# Patient Record
Sex: Female | Born: 2000 | Race: Black or African American | Hispanic: No | Marital: Married | Smoking: Never smoker
Health system: Southern US, Community
[De-identification: ages and names within clinical notes are randomized; demographics above are authoritative.]

## PROBLEM LIST (undated history)

## (undated) ENCOUNTER — Inpatient Hospital Stay (HOSPITAL_COMMUNITY): Payer: Self-pay

## (undated) DIAGNOSIS — Z789 Other specified health status: Secondary | ICD-10-CM

## (undated) HISTORY — PX: NO PAST SURGERIES: SHX2092

---
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
## 2019-06-29 ENCOUNTER — Emergency Department (HOSPITAL_COMMUNITY)
Admission: EM | Admit: 2019-06-29 | Discharge: 2019-06-29 | Discharge status: Against Medical Advice | Payer: Medicaid Other

## 2019-06-29 ENCOUNTER — Other Ambulatory Visit: Payer: Self-pay

## 2019-11-01 ENCOUNTER — Emergency Department (HOSPITAL_COMMUNITY)
Admission: EM | Admit: 2019-11-01 | Discharge: 2019-11-02 | Disposition: A | Discharge status: Home / Self Care | Payer: Medicaid Other | Attending: Emergency Medicine | Admitting: Emergency Medicine

## 2019-11-01 ENCOUNTER — Emergency Department (HOSPITAL_COMMUNITY): Payer: Medicaid Other

## 2019-11-01 ENCOUNTER — Encounter (HOSPITAL_COMMUNITY): Payer: Self-pay | Admitting: Emergency Medicine

## 2019-11-01 DIAGNOSIS — Y999 Unspecified external cause status: Secondary | ICD-10-CM | POA: Diagnosis not present

## 2019-11-01 DIAGNOSIS — R791 Abnormal coagulation profile: Secondary | ICD-10-CM | POA: Insufficient documentation

## 2019-11-01 DIAGNOSIS — S21139A Puncture wound without foreign body of unspecified front wall of thorax without penetration into thoracic cavity, initial encounter: Secondary | ICD-10-CM | POA: Insufficient documentation

## 2019-11-01 DIAGNOSIS — Y929 Unspecified place or not applicable: Secondary | ICD-10-CM | POA: Diagnosis not present

## 2019-11-01 DIAGNOSIS — R000 Tachycardia, unspecified: Secondary | ICD-10-CM | POA: Diagnosis not present

## 2019-11-01 DIAGNOSIS — S31139A Puncture wound of abdominal wall without foreign body, unspecified quadrant without penetration into peritoneal cavity, initial encounter: Secondary | ICD-10-CM | POA: Diagnosis not present

## 2019-11-01 DIAGNOSIS — T148XXA Other injury of unspecified body region, initial encounter: Secondary | ICD-10-CM

## 2019-11-01 DIAGNOSIS — S31030A Puncture wound without foreign body of lower back and pelvis without penetration into retroperitoneum, initial encounter: Secondary | ICD-10-CM | POA: Diagnosis not present

## 2019-11-01 DIAGNOSIS — T1490XA Injury, unspecified, initial encounter: Secondary | ICD-10-CM

## 2019-11-01 DIAGNOSIS — Z23 Encounter for immunization: Secondary | ICD-10-CM | POA: Diagnosis not present

## 2019-11-01 DIAGNOSIS — S21219A Laceration without foreign body of unspecified back wall of thorax without penetration into thoracic cavity, initial encounter: Secondary | ICD-10-CM | POA: Insufficient documentation

## 2019-11-01 DIAGNOSIS — Y939 Activity, unspecified: Secondary | ICD-10-CM | POA: Insufficient documentation

## 2019-11-01 DIAGNOSIS — S1193XA Puncture wound without foreign body of unspecified part of neck, initial encounter: Secondary | ICD-10-CM | POA: Insufficient documentation

## 2019-11-01 LAB — I-STAT CHEM 8, ED
BUN: 11 mg/dL (ref 6–20)
Calcium, Ion: 1.16 mmol/L (ref 1.15–1.40)
Chloride: 105 mmol/L (ref 98–111)
Creatinine, Ser: 0.7 mg/dL (ref 0.44–1.00)
Glucose, Bld: 96 mg/dL (ref 70–99)
HCT: 33 % — ABNORMAL LOW (ref 36.0–46.0)
Hemoglobin: 11.2 g/dL — ABNORMAL LOW (ref 12.0–15.0)
Potassium: 3.8 mmol/L (ref 3.5–5.1)
Sodium: 141 mmol/L (ref 135–145)
TCO2: 27 mmol/L (ref 22–32)

## 2019-11-01 LAB — COMPREHENSIVE METABOLIC PANEL
ALT: 15 U/L (ref 0–44)
AST: 16 U/L (ref 15–41)
Albumin: 3.8 g/dL (ref 3.5–5.0)
Alkaline Phosphatase: 54 U/L (ref 38–126)
Anion gap: 9 (ref 5–15)
BUN: 9 mg/dL (ref 6–20)
CO2: 26 mmol/L (ref 22–32)
Calcium: 9.3 mg/dL (ref 8.9–10.3)
Chloride: 105 mmol/L (ref 98–111)
Creatinine, Ser: 0.79 mg/dL (ref 0.44–1.00)
GFR calc Af Amer: >60 mL/min (ref >60)
GFR calc non Af Amer: >60 mL/min (ref >60)
Glucose, Bld: 98 mg/dL (ref 70–99)
Potassium: 3.5 mmol/L (ref 3.5–5.1)
Sodium: 140 mmol/L (ref 135–145)
Total Bilirubin: 0.5 mg/dL (ref 0.3–1.2)
Total Protein: 7.3 g/dL (ref 6.5–8.1)

## 2019-11-01 LAB — CBC
HCT: 36 % (ref 36.0–46.0)
Hemoglobin: 11.4 g/dL — ABNORMAL LOW (ref 12.0–15.0)
MCH: 29.6 pg (ref 26.0–34.0)
MCHC: 31.7 g/dL (ref 30.0–36.0)
MCV: 93.5 fL (ref 80.0–100.0)
Platelets: 341 10*3/uL (ref 150–400)
RBC: 3.85 MIL/uL — ABNORMAL LOW (ref 3.87–5.11)
RDW: 14.5 % (ref 11.5–15.5)
WBC: 9 10*3/uL (ref 4.0–10.5)
nRBC: 0 % (ref 0.0–0.2)

## 2019-11-01 LAB — SAMPLE TO BLOOD BANK

## 2019-11-01 LAB — ETHANOL: Alcohol, Ethyl (B): <10 mg/dL (ref <10)

## 2019-11-01 LAB — PROTIME-INR
INR: 1 (ref 0.8–1.2)
Prothrombin Time: 13.1 seconds (ref 11.4–15.2)

## 2019-11-01 MED ORDER — TETANUS-DIPHTH-ACELL PERTUSSIS 5-2.5-18.5 LF-MCG/0.5 IM SUSP
0.5000 mL | Freq: Once | INTRAMUSCULAR | Status: AC
  Administered 2019-11-01: 0.5 mL via INTRAMUSCULAR
  Filled 2019-11-01: qty 0.5

## 2019-11-01 MED ORDER — IOHEXOL 350 MG/ML SOLN
100.0000 mL | Freq: Once | INTRAVENOUS | Status: AC | PRN
  Administered 2019-11-01: 100 mL via INTRAVENOUS

## 2019-11-01 MED ORDER — FENTANYL CITRATE (PF) 100 MCG/2ML IJ SOLN
50.0000 ug | Freq: Once | INTRAMUSCULAR | Status: AC
  Administered 2019-11-01: 50 ug via INTRAVENOUS
  Filled 2019-11-01: qty 2
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
## 2019-11-01 NOTE — Progress Notes (Signed)
Chaplain engaged in initial visit with [place] and offered support.  Leah Maldonado was on the phone with her mother.  Chaplain was able to speak to Leah Maldonado's sister who is in the ED waiting area.    Chaplain will follow-up as needed.
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
## 2019-11-02 ENCOUNTER — Encounter (HOSPITAL_COMMUNITY): Payer: Self-pay

## 2019-11-02 LAB — URINALYSIS, ROUTINE W REFLEX MICROSCOPIC
Bilirubin Urine: NEGATIVE
Glucose, UA: NEGATIVE mg/dL
Hgb urine dipstick: NEGATIVE
Ketones, ur: NEGATIVE mg/dL
Leukocytes,Ua: NEGATIVE
Nitrite: NEGATIVE
Protein, ur: NEGATIVE mg/dL
Specific Gravity, Urine: 1.032 — ABNORMAL HIGH (ref 1.005–1.030)
pH: 7 (ref 5.0–8.0)

## 2019-11-02 LAB — LACTIC ACID, PLASMA: Lactic Acid, Venous: 0.8 mmol/L (ref 0.5–1.9)

## 2019-11-02 MED ORDER — OXYCODONE-ACETAMINOPHEN 5-325 MG PO TABS
1.0000 | ORAL_TABLET | Freq: Once | ORAL | Status: AC
  Administered 2019-11-02: 1 via ORAL
  Filled 2019-11-02: qty 1

## 2019-11-02 NOTE — ED Provider Notes (Signed)
They signed out to me by Dr. Rush Landmark to follow-up on trauma scans.  Scans have been reviewed and do not show any significant injury other than skin wounds.  Discussed with Dr. Doylene Canard, on-call for trauma surgery.  She feels patient can be discharged.  Discussed risks and benefits of skin closure with patient, she would prefer not to have sutures.  This will likely decrease likelihood of infection, given wound care instructions.   Gilda Crease, MD 11/02/19 Jacinta Shoe

## 2020-09-16 ENCOUNTER — Encounter (HOSPITAL_COMMUNITY)
Admission: EM | Disposition: A | Discharge status: Home / Self Care | Payer: Self-pay | Source: Home / Self Care | Attending: Emergency Medicine

## 2020-09-16 ENCOUNTER — Emergency Department (HOSPITAL_COMMUNITY): Payer: Medicaid Other | Admitting: Anesthesiology

## 2020-09-16 ENCOUNTER — Emergency Department (HOSPITAL_COMMUNITY): Payer: Medicaid Other

## 2020-09-16 ENCOUNTER — Other Ambulatory Visit: Payer: Self-pay

## 2020-09-16 ENCOUNTER — Encounter (HOSPITAL_COMMUNITY): Payer: Self-pay

## 2020-09-16 ENCOUNTER — Ambulatory Visit (HOSPITAL_COMMUNITY)
Admission: EM | Admit: 2020-09-16 | Discharge: 2020-09-16 | Disposition: A | Discharge status: Home / Self Care | Payer: Medicaid Other | Attending: Emergency Medicine | Admitting: Emergency Medicine

## 2020-09-16 DIAGNOSIS — Z20822 Contact with and (suspected) exposure to covid-19: Secondary | ICD-10-CM | POA: Diagnosis not present

## 2020-09-16 DIAGNOSIS — N939 Abnormal uterine and vaginal bleeding, unspecified: Secondary | ICD-10-CM | POA: Diagnosis present

## 2020-09-16 DIAGNOSIS — Z9889 Other specified postprocedural states: Secondary | ICD-10-CM

## 2020-09-16 DIAGNOSIS — O3680X0 Pregnancy with inconclusive fetal viability, not applicable or unspecified: Secondary | ICD-10-CM

## 2020-09-16 DIAGNOSIS — O00101 Right tubal pregnancy without intrauterine pregnancy: Secondary | ICD-10-CM

## 2020-09-16 HISTORY — PX: LAPAROSCOPIC UNILATERAL SALPINGECTOMY: SHX5934

## 2020-09-16 HISTORY — PX: DIAGNOSTIC LAPAROSCOPY WITH REMOVAL OF ECTOPIC PREGNANCY: SHX6449

## 2020-09-16 LAB — COMPREHENSIVE METABOLIC PANEL
ALT: 21 U/L (ref 0–44)
AST: 20 U/L (ref 15–41)
Albumin: 4.1 g/dL (ref 3.5–5.0)
Alkaline Phosphatase: 60 U/L (ref 38–126)
Anion gap: 12 (ref 5–15)
BUN: 9 mg/dL (ref 6–20)
CO2: 21 mmol/L — ABNORMAL LOW (ref 22–32)
Calcium: 9.5 mg/dL (ref 8.9–10.3)
Chloride: 104 mmol/L (ref 98–111)
Creatinine, Ser: 0.73 mg/dL (ref 0.44–1.00)
GFR, Estimated: >60 mL/min (ref >60)
Glucose, Bld: 150 mg/dL — ABNORMAL HIGH (ref 70–99)
Potassium: 3.9 mmol/L (ref 3.5–5.1)
Sodium: 137 mmol/L (ref 135–145)
Total Bilirubin: 0.7 mg/dL (ref 0.3–1.2)
Total Protein: 7.7 g/dL (ref 6.5–8.1)

## 2020-09-16 LAB — I-STAT BETA HCG BLOOD, ED (MC, WL, AP ONLY): I-stat hCG, quantitative: >2000 m[IU]/mL — ABNORMAL HIGH (ref <5)

## 2020-09-16 LAB — CBC WITH DIFFERENTIAL/PLATELET
Abs Immature Granulocytes: 0.05 10*3/uL (ref 0.00–0.07)
Basophils Absolute: 0 10*3/uL (ref 0.0–0.1)
Basophils Relative: 0 %
Eosinophils Absolute: 0 10*3/uL (ref 0.0–0.5)
Eosinophils Relative: 0 %
HCT: 27 % — ABNORMAL LOW (ref 36.0–46.0)
Hemoglobin: 8.6 g/dL — ABNORMAL LOW (ref 12.0–15.0)
Immature Granulocytes: 0 %
Lymphocytes Relative: 5 %
Lymphs Abs: 0.7 10*3/uL (ref 0.7–4.0)
MCH: 28 pg (ref 26.0–34.0)
MCHC: 31.9 g/dL (ref 30.0–36.0)
MCV: 87.9 fL (ref 80.0–100.0)
Monocytes Absolute: 0.4 10*3/uL (ref 0.1–1.0)
Monocytes Relative: 3 %
Neutro Abs: 12.7 10*3/uL — ABNORMAL HIGH (ref 1.7–7.7)
Neutrophils Relative %: 92 %
Platelets: 381 10*3/uL (ref 150–400)
RBC: 3.07 MIL/uL — ABNORMAL LOW (ref 3.87–5.11)
RDW: 15.6 % — ABNORMAL HIGH (ref 11.5–15.5)
WBC: 13.9 10*3/uL — ABNORMAL HIGH (ref 4.0–10.5)
nRBC: 0 % (ref 0.0–0.2)

## 2020-09-16 LAB — WET PREP, GENITAL
Clue Cells Wet Prep HPF POC: NONE SEEN
Sperm: NONE SEEN
Trich, Wet Prep: NONE SEEN
Yeast Wet Prep HPF POC: NONE SEEN

## 2020-09-16 LAB — CBC
HCT: 25.4 % — ABNORMAL LOW (ref 36.0–46.0)
Hemoglobin: 8.2 g/dL — ABNORMAL LOW (ref 12.0–15.0)
MCH: 28.6 pg (ref 26.0–34.0)
MCHC: 32.3 g/dL (ref 30.0–36.0)
MCV: 88.5 fL (ref 80.0–100.0)
Platelets: 381 10*3/uL (ref 150–400)
RBC: 2.87 MIL/uL — ABNORMAL LOW (ref 3.87–5.11)
RDW: 15.4 % (ref 11.5–15.5)
WBC: 14.3 10*3/uL — ABNORMAL HIGH (ref 4.0–10.5)
nRBC: 0 % (ref 0.0–0.2)

## 2020-09-16 LAB — RESP PANEL BY RT-PCR (FLU A&B, COVID) ARPGX2
Influenza A by PCR: NEGATIVE
Influenza B by PCR: NEGATIVE
SARS Coronavirus 2 by RT PCR: NEGATIVE

## 2020-09-16 LAB — TYPE AND SCREEN
ABO/RH(D): A POS
Antibody Screen: NEGATIVE

## 2020-09-16 LAB — HIV ANTIBODY (ROUTINE TESTING W REFLEX): HIV Screen 4th Generation wRfx: NONREACTIVE

## 2020-09-16 LAB — HCG, QUANTITATIVE, PREGNANCY: hCG, Beta Chain, Quant, S: 3414 m[IU]/mL — ABNORMAL HIGH (ref <5)

## 2020-09-16 LAB — LIPASE, BLOOD: Lipase: 30 U/L (ref 11–51)

## 2020-09-16 SURGERY — LAPAROSCOPY, WITH ECTOPIC PREGNANCY SURGICAL TREATMENT
Anesthesia: General | Laterality: Right

## 2020-09-16 MED ORDER — ONDANSETRON HCL 4 MG/2ML IJ SOLN
INTRAMUSCULAR | Status: AC
  Filled 2020-09-16: qty 2

## 2020-09-16 MED ORDER — HYDROMORPHONE HCL 1 MG/ML IJ SOLN: 0.2500 mg | INTRAMUSCULAR | Status: DC | PRN

## 2020-09-16 MED ORDER — DEXAMETHASONE SODIUM PHOSPHATE 10 MG/ML IJ SOLN
INTRAMUSCULAR | Status: DC | PRN
  Administered 2020-09-16: 10 mg via INTRAVENOUS

## 2020-09-16 MED ORDER — OXYCODONE HCL 5 MG PO TABS: 5.0000 mg | ORAL_TABLET | Freq: Once | ORAL | Status: DC | PRN

## 2020-09-16 MED ORDER — MORPHINE SULFATE (PF) 4 MG/ML IV SOLN
4.0000 mg | Freq: Once | INTRAVENOUS | Status: AC
  Administered 2020-09-16: 4 mg via INTRAVENOUS
  Filled 2020-09-16: qty 1

## 2020-09-16 MED ORDER — CHLORHEXIDINE GLUCONATE 0.12 % MT SOLN: 15.0000 mL | Freq: Once | OROMUCOSAL | Status: AC

## 2020-09-16 MED ORDER — MIDAZOLAM HCL 2 MG/2ML IJ SOLN: 0.5000 mg | Freq: Once | INTRAMUSCULAR | Status: DC | PRN

## 2020-09-16 MED ORDER — LIDOCAINE 2% (20 MG/ML) 5 ML SYRINGE
INTRAMUSCULAR | Status: DC | PRN
  Administered 2020-09-16: 20 mg via INTRAVENOUS

## 2020-09-16 MED ORDER — BUPIVACAINE HCL (PF) 0.5 % IJ SOLN
INTRAMUSCULAR | Status: AC
  Filled 2020-09-16: qty 30

## 2020-09-16 MED ORDER — MIDAZOLAM HCL 2 MG/2ML IJ SOLN
INTRAMUSCULAR | Status: DC | PRN
  Administered 2020-09-16: 2 mg via INTRAVENOUS

## 2020-09-16 MED ORDER — ONDANSETRON HCL 4 MG/2ML IJ SOLN
INTRAMUSCULAR | Status: DC | PRN
  Administered 2020-09-16: 4 mg via INTRAVENOUS

## 2020-09-16 MED ORDER — SODIUM CHLORIDE 0.9 % IV BOLUS
1000.0000 mL | Freq: Once | INTRAVENOUS | Status: AC
  Administered 2020-09-16: 1000 mL via INTRAVENOUS

## 2020-09-16 MED ORDER — BUPIVACAINE HCL 0.5 % IJ SOLN
INTRAMUSCULAR | Status: DC | PRN
  Administered 2020-09-16: 8 mL

## 2020-09-16 MED ORDER — MEPERIDINE HCL 25 MG/ML IJ SOLN: 6.2500 mg | INTRAMUSCULAR | Status: DC | PRN

## 2020-09-16 MED ORDER — PROPOFOL 10 MG/ML IV BOLUS
INTRAVENOUS | Status: AC
  Filled 2020-09-16: qty 20

## 2020-09-16 MED ORDER — LIDOCAINE 2% (20 MG/ML) 5 ML SYRINGE
INTRAMUSCULAR | Status: AC
  Filled 2020-09-16: qty 5

## 2020-09-16 MED ORDER — SUCCINYLCHOLINE CHLORIDE 200 MG/10ML IV SOSY
PREFILLED_SYRINGE | INTRAVENOUS | Status: AC
  Filled 2020-09-16: qty 10

## 2020-09-16 MED ORDER — OXYCODONE-ACETAMINOPHEN 5-325 MG PO TABS: 1.0000 | ORAL_TABLET | Freq: Four times a day (QID) | ORAL | 0 refills | Status: DC | PRN

## 2020-09-16 MED ORDER — PROPOFOL 10 MG/ML IV BOLUS
INTRAVENOUS | Status: DC | PRN
  Administered 2020-09-16: 200 mg via INTRAVENOUS

## 2020-09-16 MED ORDER — 0.9 % SODIUM CHLORIDE (POUR BTL) OPTIME
TOPICAL | Status: DC | PRN
  Administered 2020-09-16: 1000 mL

## 2020-09-16 MED ORDER — PHENYLEPHRINE HCL (PRESSORS) 10 MG/ML IV SOLN
INTRAVENOUS | Status: DC | PRN
  Administered 2020-09-16: 80 ug via INTRAVENOUS

## 2020-09-16 MED ORDER — OXYCODONE HCL 5 MG/5ML PO SOLN
5.0000 mg | Freq: Once | ORAL | Status: DC | PRN
Start: 2020-09-16 — End: 2020-09-17

## 2020-09-16 MED ORDER — SUCCINYLCHOLINE CHLORIDE 200 MG/10ML IV SOSY
PREFILLED_SYRINGE | INTRAVENOUS | Status: DC | PRN
  Administered 2020-09-16: 140 mg via INTRAVENOUS

## 2020-09-16 MED ORDER — DEXAMETHASONE SODIUM PHOSPHATE 10 MG/ML IJ SOLN
INTRAMUSCULAR | Status: AC
  Filled 2020-09-16: qty 1

## 2020-09-16 MED ORDER — DOCUSATE SODIUM 100 MG PO CAPS: 100.0000 mg | ORAL_CAPSULE | Freq: Two times a day (BID) | ORAL | 1 refills | Status: DC

## 2020-09-16 MED ORDER — SODIUM CHLORIDE 0.9% IV SOLUTION: Freq: Once | INTRAVENOUS | Status: DC

## 2020-09-16 MED ORDER — CHLORHEXIDINE GLUCONATE 0.12 % MT SOLN
OROMUCOSAL | Status: AC
  Administered 2020-09-16: 15 mL via OROMUCOSAL
  Filled 2020-09-16: qty 15

## 2020-09-16 MED ORDER — PROMETHAZINE HCL 25 MG/ML IJ SOLN
6.2500 mg | INTRAMUSCULAR | Status: DC | PRN
Start: 2020-09-16 — End: 2020-09-17

## 2020-09-16 MED ORDER — ALBUMIN HUMAN 5 % IV SOLN: INTRAVENOUS | Status: DC | PRN

## 2020-09-16 MED ORDER — SUGAMMADEX SODIUM 200 MG/2ML IV SOLN
INTRAVENOUS | Status: DC | PRN
  Administered 2020-09-16: 200 mg via INTRAVENOUS

## 2020-09-16 MED ORDER — SODIUM CHLORIDE 0.9 % IV SOLN: Freq: Once | INTRAVENOUS | Status: AC

## 2020-09-16 MED ORDER — PHENYLEPHRINE 40 MCG/ML (10ML) SYRINGE FOR IV PUSH (FOR BLOOD PRESSURE SUPPORT)
PREFILLED_SYRINGE | INTRAVENOUS | Status: AC
  Filled 2020-09-16: qty 10

## 2020-09-16 MED ORDER — IBUPROFEN 600 MG PO TABS: 600.0000 mg | ORAL_TABLET | Freq: Four times a day (QID) | ORAL | 0 refills | Status: DC | PRN

## 2020-09-16 MED ORDER — FENTANYL CITRATE (PF) 250 MCG/5ML IJ SOLN
INTRAMUSCULAR | Status: DC | PRN
  Administered 2020-09-16 (×3): 50 ug via INTRAVENOUS

## 2020-09-16 MED ORDER — ROCURONIUM BROMIDE 10 MG/ML (PF) SYRINGE
PREFILLED_SYRINGE | INTRAVENOUS | Status: AC
  Filled 2020-09-16: qty 10

## 2020-09-16 MED ORDER — SODIUM CHLORIDE 0.9 % IR SOLN
Status: DC | PRN
  Administered 2020-09-16: 1000 mL

## 2020-09-16 MED ORDER — SODIUM CHLORIDE 0.9 % IV SOLN: INTRAVENOUS | Status: DC

## 2020-09-16 MED ORDER — LACTATED RINGERS IV SOLN: INTRAVENOUS | Status: DC

## 2020-09-16 MED ORDER — ROCURONIUM BROMIDE 10 MG/ML (PF) SYRINGE
PREFILLED_SYRINGE | INTRAVENOUS | Status: DC | PRN
  Administered 2020-09-16: 50 mg via INTRAVENOUS
  Administered 2020-09-16: 20 mg via INTRAVENOUS

## 2020-09-16 MED ORDER — FERROUS SULFATE 325 (65 FE) MG PO TABS: 325.0000 mg | ORAL_TABLET | ORAL | 0 refills | Status: DC

## 2020-09-16 MED ORDER — ORAL CARE MOUTH RINSE: 15.0000 mL | Freq: Once | OROMUCOSAL | Status: AC

## 2020-09-16 MED ORDER — FENTANYL CITRATE (PF) 250 MCG/5ML IJ SOLN
INTRAMUSCULAR | Status: AC
  Filled 2020-09-16: qty 5

## 2020-09-16 MED ORDER — MIDAZOLAM HCL 2 MG/2ML IJ SOLN
INTRAMUSCULAR | Status: AC
  Filled 2020-09-16: qty 2

## 2020-09-16 SURGICAL SUPPLY — 43 items
ADH SKN CLS APL DERMABOND .7 (GAUZE/BANDAGES/DRESSINGS) ×2
APL SWBSTK 6 STRL LF DISP (MISCELLANEOUS) ×2
APPLICATOR COTTON TIP 6 STRL (MISCELLANEOUS) ×2 IMPLANT
APPLICATOR COTTON TIP 6IN STRL (MISCELLANEOUS) ×3 IMPLANT
BAG SPEC RTRVL LRG 6X4 10 (ENDOMECHANICALS) ×2
BLADE SURG 15 STRL LF DISP TIS (BLADE) ×2 IMPLANT
BLADE SURG 15 STRL SS (BLADE) ×3
CABLE HIGH FREQUENCY MONO STRZ (ELECTRODE) IMPLANT
DEFOGGER SCOPE WARMER CLEARIFY (MISCELLANEOUS) ×3 IMPLANT
DERMABOND ADVANCED (GAUZE/BANDAGES/DRESSINGS) ×1
DERMABOND ADVANCED .7 DNX12 (GAUZE/BANDAGES/DRESSINGS) ×2 IMPLANT
DRSG OPSITE POSTOP 3X4 (GAUZE/BANDAGES/DRESSINGS) IMPLANT
DURAPREP 26ML APPLICATOR (WOUND CARE) ×3 IMPLANT
ELECT REM PT RETURN 9FT ADLT (ELECTROSURGICAL) ×3
ELECTRODE REM PT RTRN 9FT ADLT (ELECTROSURGICAL) ×2 IMPLANT
GLOVE SURG POLYISO LF SZ7 (GLOVE) ×5 IMPLANT
GLOVE SURG UNDER POLY LF SZ7 (GLOVE) ×6 IMPLANT
GLOVE SURG UNDER POLY LF SZ7.5 (GLOVE) ×6 IMPLANT
GOWN STRL REUS W/ TWL LRG LVL3 (GOWN DISPOSABLE) ×4 IMPLANT
GOWN STRL REUS W/TWL LRG LVL3 (GOWN DISPOSABLE) ×6
IRRIGATOR SUCT 8 DISP DVNC XI (IRRIGATION / IRRIGATOR) ×1 IMPLANT
IRRIGATOR SUCTION 8MM XI DISP (IRRIGATION / IRRIGATOR) ×3
KIT TURNOVER KIT B (KITS) ×3 IMPLANT
LIGASURE VESSEL 5MM BLUNT TIP (ELECTROSURGICAL) ×2 IMPLANT
NS IRRIG 1000ML POUR BTL (IV SOLUTION) ×3 IMPLANT
PACK LAPAROSCOPY BASIN (CUSTOM PROCEDURE TRAY) ×3 IMPLANT
PACK TRENDGUARD 450 HYBRID PRO (MISCELLANEOUS) ×1 IMPLANT
PAD OB MATERNITY 4.3X12.25 (PERSONAL CARE ITEMS) ×3 IMPLANT
POUCH LAPAROSCOPIC INSTRUMENT (MISCELLANEOUS) ×3 IMPLANT
POUCH SPECIMEN RETRIEVAL 10MM (ENDOMECHANICALS) ×2 IMPLANT
PROTECTOR NERVE ULNAR (MISCELLANEOUS) ×6 IMPLANT
SCISSORS LAP 5X35 DISP (ENDOMECHANICALS) IMPLANT
SET TUBE SMOKE EVAC HIGH FLOW (TUBING) ×3 IMPLANT
SLEEVE ADV FIXATION 5X100MM (TROCAR) ×2 IMPLANT
SUT MON AB 4-0 PS1 27 (SUTURE) ×3 IMPLANT
SUT VICRYL 0 UR6 27IN ABS (SUTURE) ×3 IMPLANT
SYR 10ML LL (SYRINGE) ×3 IMPLANT
TOWEL GREEN STERILE FF (TOWEL DISPOSABLE) ×6 IMPLANT
TRAY FOLEY W/BAG SLVR 14FR (SET/KITS/TRAYS/PACK) ×3 IMPLANT
TRENDGUARD 450 HYBRID PRO PACK (MISCELLANEOUS) ×3
TROCAR ADV FIXATION 5X100MM (TROCAR) ×2 IMPLANT
TROCAR BALLN 12MMX100 BLUNT (TROCAR) ×1 IMPLANT
WARMER LAPAROSCOPE (MISCELLANEOUS) ×3 IMPLANT

## 2020-09-16 NOTE — Anesthesia Preprocedure Evaluation (Addendum)
Anesthesia Evaluation  Patient identified by MRN, date of birth, ID band Patient awake    Reviewed: Allergy & Precautions, NPO status , Patient's Chart, lab work & pertinent test results  History of Anesthesia Complications Negative for: history of anesthetic complications  Airway Mallampati: III  TM Distance: >3 FB Neck ROM: Full    Dental  (+) Dental Advisory Given   Pulmonary Current Smoker and Patient abstained from smoking.,  09/16/2020 SARS coronavirus NEG   breath sounds clear to auscultation       Cardiovascular negative cardio ROS   Rhythm:Regular Rate:Normal     Neuro/Psych negative neurological ROS     GI/Hepatic (+)     substance abuse  marijuana use, Nausea and vomiting   Endo/Other  obese  Renal/GU negative Renal ROS     Musculoskeletal   Abdominal (+) + obese,   Peds  Hematology  (+) Blood dyscrasia (Hb 8.6), anemia ,   Anesthesia Other Findings   Reproductive/Obstetrics (+) Pregnancy (presumed ectopic)                            Anesthesia Physical Anesthesia Plan  ASA: 2 and emergent  Anesthesia Plan: General   Post-op Pain Management:    Induction: Intravenous and Rapid sequence  PONV Risk Score and Plan: 3 and Ondansetron, Dexamethasone and Scopolamine patch - Pre-op  Airway Management Planned: Oral ETT and Video Laryngoscope Planned  Additional Equipment: None  Intra-op Plan:   Post-operative Plan: Extubation in OR  Informed Consent: I have reviewed the patients History and Physical, chart, labs and discussed the procedure including the risks, benefits and alternatives for the proposed anesthesia with the patient or authorized representative who has indicated his/her understanding and acceptance.     Dental advisory given  Plan Discussed with: CRNA and Surgeon  Anesthesia Plan Comments:         Anesthesia Quick Evaluation

## 2020-09-16 NOTE — ED Provider Notes (Signed)
[facility]-EMERGENCY DEPT Provider Note   CSN: [id number] Arrival date & time: 09/16/20  1111     History Chief Complaint  Patient presents with   Vaginal Bleeding   Shoulder Pain    Leah Maldonado is a 20 y.o. female medical history as noted below.  Presents to the emergency department with a chief complaint of vaginal bleeding and pain to upper abdomen and torso.  Patient reports that her pain is located to her entire chest, back, and bilateral shoulders as well as upper left and right abdomen.  Patient reports that pain started yesterday at 2200.  Pain has been constant since then.  Patient rates pain 8/10 on the pain scale.  Pain is worse with movement.  Pain is better when she lays in supine position.  Patient reports that she has not tried any over-the-counter medications to alleviate her pain.  Endorses nausea and vomiting.  Has vomited 4-6 times in the last 24 hours.  Emesis is described as stomach contents.  No emesis or coffee-ground emesis.  Patient endorses shortness of breath and chills.  Patient denies any history of PE/DVT, hormone therapy, surgery in the last 4 weeks, cancer treatment, unilateral leg swelling or tenderness.  Patient denies tobacco use.  Patient reports he started having vaginal bleeding yesterday evening.  Patient describes vaginal bleeding as "heavy and dark.  Patient has not been using any pads or tampons menstrual bleeding began.  Patient denies any dysuria, vaginal pain, vaginal discharge, pelvic pain.  LMP is 7/7-7/13.  Patient is sexually active in a mutually monogamous relationship with a female partner.  G2 P0-1-1-1.  Denies any history of abdominal surgeries.  Patient denies any fevers, URI symptoms, abdominal distention, diarrhea, constipation, blood in stool, melena.     Vaginal Bleeding Associated symptoms: abdominal pain, back pain and nausea   Associated symptoms: no dizziness, no dysuria, no fever and no vaginal discharge    Shoulder Pain Associated symptoms: back pain   Associated symptoms: no fever and no neck pain       Past Medical History:  Diagnosis Date   Medical history non-contributory     Patient Active Problem List   Diagnosis Date Noted   Post term pregnancy at [redacted] weeks gestation 07/02/2016   Normal labor and delivery 07/02/2016   Supervision of normal first teen pregnancy in third trimester 07/01/2016   Marijuana use 07/01/2016   Tobacco use 07/01/2016   Chlamydia infection affecting pregnancy 07/01/2016    Past Surgical History:  Procedure Laterality Date   NO PAST SURGERIES       OB History     Gravida  2   Para  0   Term  0   Preterm  0   AB  1   Living         SAB  1   IAB  0   Ectopic  0   Multiple      Live Births              History reviewed. No pertinent family history.  Social History   Tobacco Use   Smoking status: Never   Smokeless tobacco: Never  Substance Use Topics   Alcohol use: Yes   Drug use: Not Currently    Home Medications Prior to Admission medications   Medication Sig Start Date End Date Taking? Authorizing Provider  ibuprofen (ADVIL,MOTRIN) 600 MG tablet Take 1 tablet (600 mg total) by mouth every 6 (six) hours. 07/05/16  Cresenzo-Dishmon, Scarlette Calico, CNM  Prenatal Vit-Fe Fumarate-FA (PRENATAL MULTIVITAMIN) TABS tablet Take 1 tablet by mouth daily at 12 noon.    [provider]    Allergies    Patient has no known allergies.  Review of Systems   Review of Systems  Constitutional:  Positive for chills. Negative for fever.  HENT:  Negative for congestion, rhinorrhea and sore throat.   Eyes:  Negative for visual disturbance.  Respiratory:  Positive for shortness of breath. Negative for cough.   Cardiovascular:  Positive for chest pain.  Gastrointestinal:  Positive for abdominal pain, diarrhea, nausea and vomiting. Negative for abdominal distention, anal bleeding, blood in stool, constipation and rectal pain.   Genitourinary:  Positive for vaginal bleeding. Negative for difficulty urinating, dysuria, frequency, pelvic pain, urgency, vaginal discharge and vaginal pain.  Musculoskeletal:  Positive for back pain. Negative for neck pain.  Skin:  Negative for color change, pallor, rash and wound.  Neurological:  Negative for dizziness, syncope, weakness, light-headedness, numbness and headaches.  Psychiatric/Behavioral:  Negative for confusion.    Physical Exam Updated Vital Signs BP 116/87 (BP Location: Left Arm)   Pulse (!) 132   Temp 98.1 F (36.7 C) (Oral)   Resp (!) 22   LMP 09/11/2020   SpO2 100%   Physical Exam Vitals and nursing note reviewed. Exam conducted with a chaperone present Tax adviser present as chaperone).  Constitutional:      General: She is not in acute distress.    Appearance: She is not ill-appearing, toxic-appearing or diaphoretic.  HENT:     Head: Normocephalic.  Eyes:     General: No scleral icterus.       Right eye: No discharge.        Left eye: No discharge.  Cardiovascular:     Rate and Rhythm: Tachycardia present.     Heart sounds: Normal heart sounds.     Comments: Tachycardic at rate of 132 Pulmonary:     Effort: Pulmonary effort is normal. Tachypnea present. No bradypnea or respiratory distress.     Breath sounds: Normal breath sounds. No stridor.     Comments: Tachypneic at rate of 20 Abdominal:     General: Abdomen is flat. There is no distension. There are no signs of injury.     Palpations: Abdomen is soft. There is no mass or pulsatile mass.     Tenderness: There is abdominal tenderness in the right upper quadrant and left upper quadrant. There is no guarding or rebound.     Hernia: There is no hernia in the umbilical area, ventral area, left inguinal area or right inguinal area.  Genitourinary:    Exam position: Lithotomy position.     Pubic Area: No rash or pubic lice.      Tanner stage (genital): 5.     Labia:        Right: No rash,  tenderness, lesion or injury.        Left: No rash, tenderness, lesion or injury.      Urethra: No prolapse.     Vagina: No signs of injury and foreign body. Bleeding present. No vaginal discharge, erythema, tenderness, lesions or prolapsed vaginal walls.     Cervix: Cervical bleeding present. No cervical motion tenderness, discharge, friability, lesion, erythema or eversion.     Uterus: Tender. Not enlarged.      Adnexa:        Right: No mass, tenderness or fullness.         Left:  Tenderness present. No mass or fullness.       Comments: Scant dark red blood noted in vaginal vault and from cervical os.  Cervical os is closed.  No cervical discharge noted.  Tenderness to uterus and left adnexa, no mass or enlargement noted. Musculoskeletal:     Cervical back: Neck supple.     Right lower leg: Normal. No swelling or tenderness. No edema.     Left lower leg: Normal. No swelling or tenderness. No edema.  Lymphadenopathy:     Lower Body: No right inguinal adenopathy. No left inguinal adenopathy.  Skin:    General: Skin is warm and dry.  Neurological:     General: No focal deficit present.     Mental Status: She is alert and oriented to person, place, and time.     GCS: GCS eye subscore is 4. GCS verbal subscore is 5. GCS motor subscore is 6.  Psychiatric:        Behavior: Behavior is cooperative.    ED Results / Procedures / Treatments   Labs (all labs ordered are listed, but only abnormal results are displayed) Labs Reviewed  WET PREP, GENITAL - Abnormal; Notable for the following components:      Result Value   WBC, Wet Prep HPF POC RARE (*)    All other components within normal limits  COMPREHENSIVE METABOLIC PANEL - Abnormal; Notable for the following components:   CO2 21 (*)    Glucose, Bld 150 (*)    All other components within normal limits  CBC WITH DIFFERENTIAL/PLATELET - Abnormal; Notable for the following components:   WBC 13.9 (*)    RBC 3.07 (*)    Hemoglobin 8.6 (*)     HCT 27.0 (*)    RDW 15.6 (*)    Neutro Abs 12.7 (*)    All other components within normal limits  HCG, QUANTITATIVE, PREGNANCY - Abnormal; Notable for the following components:   hCG, Beta Chain, Quant, S 3,414 (*)    All other components within normal limits  I-STAT BETA HCG BLOOD, ED ([facility], [facility], [facility] ONLY) - Abnormal; Notable for the following components:   I-stat hCG, quantitative >2,000.0 (*)    All other components within normal limits  LIPASE, BLOOD  CBC WITH DIFFERENTIAL/PLATELET  URINALYSIS, ROUTINE W REFLEX MICROSCOPIC  RPR  HIV ANTIBODY (ROUTINE TESTING W REFLEX)  GC/CHLAMYDIA PROBE AMP ([place]) NOT AT [facility]    EKG None  Radiology DG Chest Portable 1 View  Result Date: 09/16/2020 CLINICAL DATA:  20 year old female with history of shortness of breath. EXAM: PORTABLE CHEST 1 VIEW COMPARISON:  Chest x-ray 11/01/2019. FINDINGS: Lung volumes are normal. No consolidative airspace disease. No pleural effusions. No pneumothorax. No pulmonary nodule or mass noted. Pulmonary vasculature and the cardiomediastinal silhouette are within normal limits. IMPRESSION: No radiographic evidence of acute cardiopulmonary disease. Electronically Signed   By: Trudie Reed M.D.   On: 09/16/2020 12:24    Procedures Procedures   Medications Ordered in ED Medications - No data to display  ED Course  I have reviewed the triage vital signs and the nursing notes.  Pertinent labs & imaging results that were available during my care of the patient were reviewed by me and considered in my medical decision making (see chart for details).    MDM Rules/Calculators/A&P                          Alert 20 year old female no acute  distress, nontoxic-appearing.  Patient presents emergency department with a chief complaint of pain to her torso, upper abdomen, and vaginal bleeding.  Patient's symptoms started yesterday evening.  LMP is 7/7-7/13.  Patient is sexually active in a mutually monogamous  relationship with a female partner.  G2 P0-1-1-1.  Abdomen soft, nondistended, tenderness to left upper and right upper quadrant.  No guarding or rebound tenderness, mass or pulsatile mass noted.  Will obtain EKG test, CMP, CBC, lipase.  Lungs clear to auscultation bilaterally.  S1, S2 present with no murmurs rubs or gallops.  +2 radial pulse bilaterally.  Will obtain chest x-ray to evaluate for pneumothorax.  Patient has no history of PE or DVT, malignancy treatment, surgery last 4 weeks, denies any hemoptysis or unilateral leg swelling or tenderness.  Will attempt to control patient's pain and reassess pulse rate prior to obtaining any lab work or imaging for PE.  On repeat examination patient reports improvement in her symptoms after receiving fluids and morphine.  Patient no longer tachypneic or tachycardic.  I-STAT beta-hCG greater than 2000; will obtain quantitative beta-hCG and ultrasound to rule out ectopic pregnancy.   Previous blood typing shows patient as A+ CBC shows anemia with hemoglobin at 8.6, hematocrit at 27.0, leukocytosis of 13.9 CMP unremarkable Lipase within normal limits Wet prep shows WBC rare  Ultrasound shows no intrauterine pregnancy, hemoperitoneum.  Will consult on-call OB/GYN  1510 spoke to OB/GYN provider on-call Dr. Vergie LivingPickens who request patient be sent to [facility] for further evaluation.  Patient will need to be brought to operating room for diagnostic laparoscopic evaluation.   Final Clinical Impression(s) / ED Diagnoses Final diagnoses:  Vaginal bleeding  Status post surgery    Rx / DC Orders ED Discharge Orders     None        Berneice HeinrichBadalamente, Jameica Couts R, PA-C 09/16/20 2207    Lorre NickAllen, Anthony, MD 09/19/20 1527

## 2020-09-16 NOTE — ED Notes (Signed)
Consult to Obstetrics/Gynecology@15 :00pm.

## 2020-09-16 NOTE — Anesthesia Procedure Notes (Addendum)
Procedure Name: Intubation Date/Time: 09/16/2020 5:00 PM Performed by: Dairl Ponder, CRNA Pre-anesthesia Checklist: Patient identified, Emergency Drugs available, Suction available and Patient being monitored Patient Re-evaluated:Patient Re-evaluated prior to induction Oxygen Delivery Method: Circle System Utilized Preoxygenation: Pre-oxygenation with 100% oxygen Induction Type: IV induction, Rapid sequence and Cricoid Pressure applied Laryngoscope Size: Glidescope and 3 Grade View: Grade I Tube type: Oral Tube size: 7.0 mm Number of attempts: 1 Airway Equipment and Method: Stylet and Oral airway Placement Confirmation: ETT inserted through vocal cords under direct vision, positive ETCO2 and breath sounds checked- equal and bilateral Secured at: 22 cm Tube secured with: Tape Dental Injury: Teeth and Oropharynx as per pre-operative assessment

## 2020-09-16 NOTE — Anesthesia Postprocedure Evaluation (Signed)
Anesthesia Post Note  Patient: Leah Maldonado  Procedure(s) Performed: DIAGNOSTIC LAPAROSCOPY WITH REMOVAL OF ECTOPIC PREGNANCY LAPAROSCOPIC UNILATERAL SALPINGECTOMY (Right)     Patient location during evaluation: PACU Anesthesia Type: General Level of consciousness: awake and alert, patient cooperative and oriented Pain management: pain level controlled Vital Signs Assessment: post-procedure vital signs reviewed and stable Respiratory status: spontaneous breathing, nonlabored ventilation and respiratory function stable Cardiovascular status: blood pressure returned to baseline and stable Postop Assessment: no apparent nausea or vomiting and able to ambulate Anesthetic complications: no   No notable events documented.  Last Vitals:  Vitals:   09/16/20 1925 09/16/20 1940  BP: 133/85 134/82  Pulse: (!) 109 88  Resp: (!) 22 (!) 21  Temp: 36.7 C 36.7 C  SpO2: 91% 100%    Last Pain:  Vitals:   09/16/20 1940  TempSrc:   PainSc: 0-No pain                 [name],E. Jvon Meroney

## 2020-09-16 NOTE — Discharge Instructions (Addendum)
° °  Instructions Following Laparoscopic Surgery °You have just undergone a laparoscopic surgery.  The following list should answer your most common questions.  Although we will discuss your surgery and post-operative instructions with you prior to your discharge, this list will serve as a reminder if you fail to recall the details of what we discussed. ° °We will discuss your surgery once again in detail at your post-op visit in two to four weeks. If you haven’t already done so, please call to make your appointment as soon as possible. ° °How you will feel: Although you have just undergone a major surgery, your recovery will be significantly shorter since the surgery was performed through much smaller incisions than the traditional approach.  You should feel slightly better each day.  If you suddenly feel much worse than the prior day, please call the clinic.  It’s important during the early part of your recovery that you maintain some activity.  Walking is encouraged.  You will quicken your recovery by continued activity. ° °Incision:  Your incisions will be closed with dissolvable stitches or surgical adhesive (glue).  There may be Band-aids and/or Steri-strips covering your incisions.  If there is no drainage from the incisions you may remove the Band-aids in one to two days.  You may notice some minor bruising at the incision sites.  This is common and will resolve within several days.  Please inform us if the redness at the edges of your incision appears to be spreading.  If the skin around your incision becomes warm to the touch, or if you notice a pus-like drainage, please call the office. ° °Stairs/Driving/Activities: You may climb stairs if necessary.  If you’ve had general anesthesia, do not drive a car the rest of the day today.  You may begin light housework when you feel up to it, but avoid heavy lifting (more than 15-20lbs) or pushing until cleared for these activities by your physician. ° °Hygiene:   Do not soak your incisions.  Showers are acceptable but you may not take a bath or swim in a pool.  Cleanse your incisions daily with soap and water. ° °Medications:  Please resume taking any medications that you were taking prior to the surgery.  If we have prescribed any new medications for you, please take them as directed. ° °Constipation:  It is fairly common to experience some difficulty in moving your bowels following major surgery.  Being active will help to reduce this likelihood. A diet rich in fiber and plenty of liquids is desirable.  If you do become constipated, a mild laxative such as Miralax, Milk of Magnesia, or Metamucil, or a stool softener such as Colace, is recommended. ° °General Instructions: If you develop a fever of 100.5 degrees or higher, please call the office number(s) below for physician on call. ° ° ° °

## 2020-09-16 NOTE — Transfer of Care (Signed)
Immediate Anesthesia Transfer of Care Note  Patient: Leah Maldonado  Procedure(s) Performed: DIAGNOSTIC LAPAROSCOPY WITH REMOVAL OF ECTOPIC PREGNANCY LAPAROSCOPIC UNILATERAL SALPINGECTOMY (Right)  Patient Location: PACU  Anesthesia Type:General  Level of Consciousness: drowsy  Airway & Oxygen Therapy: Patient Spontanous Breathing  Post-op Assessment: Report given to RN and Post -op Vital signs reviewed and stable  Post vital signs: Reviewed and stable  Last Vitals:  Vitals Value Taken Time  BP 149/72 09/16/20 1842  Temp    Pulse    Resp 19 09/16/20 1843  SpO2    Vitals shown include unvalidated device data.  Last Pain:  Vitals:   09/16/20 1647  TempSrc: Oral  PainSc: 0-No pain      Patients Stated Pain Goal: 4 (09/16/20 1647)  Complications: No notable events documented.

## 2020-09-16 NOTE — H&P (Signed)
Obstetrics & Gynecology H&P   Date of Admission: 09/16/2020    Primary OBGYN: None  Reason for Admission: concern for ruptured ectopic pregnancy  History of Present Illness: Leah Maldonado is a 20 y.o. G3P1011 (Patient's last menstrual period was 09/11/2020.), with the above CC. PMHx is significant for h/o STD.  Patient presented to [facility] ED with abdominal pain, back and VB. Beta hcg  3414 and u/s negative in the uterus and adnexa but moderate amount of FF in the belly and hgb was 8.6 at noon. I told them to make her NPO, obtain a covid swab and transfer her to [facility] due to need for surgery.  Patient currently stable with lower belly and back pain and still having some VB. Istat Hgb 9.1 here in pre op  ROS: A 12-point review of systems was performed and negative, except as stated in the above HPI.  OBGYN History: As per HPI. OB History  Gravida Para Term Preterm AB Living  3 1 1  0 1 1  SAB IAB Ectopic Multiple Live Births  1 0 0   1    # Outcome Date GA Lbr Len/2nd Weight Sex Delivery Anes PTL Lv  3 Current           2 Term 2018     Vag-Spont   LIV  1 SAB             Periods: qmonth, regular She is currently using nothing for contraception.    Past Medical History: Past Medical History:  Diagnosis Date   Chlamydia infection affecting pregnancy 07/01/2016   Tx TOC negative   Medical history non-contributory     Past Surgical History: Past Surgical History:  Procedure Laterality Date   NO PAST SURGERIES      Family History:  History reviewed. No pertinent family history.  Social History:  Social History   Socioeconomic History   Marital status: Single    Spouse name: Not on file   Number of children: Not on file   Years of education: Not on file   Highest education level: Not on file  Occupational History   Not on file  Tobacco Use   Smoking status: Never   Smokeless tobacco: Never  Substance and Sexual Activity   Alcohol use: Yes   Drug use: Not  Currently   Sexual activity: Yes    Birth control/protection: Condom  Other Topics Concern   Not on file  Social History Narrative   ** Merged History Encounter **       Social Determinants of Health   Financial Resource Strain: Not on file  Food Insecurity: Not on file  Transportation Needs: Not on file  Physical Activity: Not on file  Stress: Not on file  Social Connections: Not on file  Intimate Partner Violence: Not on file    Allergy: No Known Allergies  Current Outpatient Medications: Medications Prior to Admission  Medication Sig Dispense Refill Last Dose   acetaminophen (TYLENOL) 500 MG tablet Take 500 mg by mouth every 6 (six) hours as needed.   09/15/2020     Hospital Medications: Current Facility-Administered Medications  Medication Dose Route Frequency Provider Last Rate Last Admin   [MAR Hold] 0.9 %  sodium chloride infusion (Manually program via Guardrails IV Fluids)   Intravenous Once 09/17/2020, MD       0.9 %  sodium chloride infusion   Intravenous Continuous Jairo Ben, MD       chlorhexidine (PERIDEX) 0.12 %  solution 15 mL  15 mL Mouth/Throat Once Jairo Ben, MD       Or   MEDLINE mouth rinse  15 mL Mouth Rinse Once Jairo Ben, MD       chlorhexidine (PERIDEX) 0.12 % solution            lactated ringers infusion   Intravenous Continuous Jairo Ben, MD         Physical Exam:  Current Vital Signs 24h Vital Sign Ranges  T 98.1 F (36.7 C) Temp  Avg: 98.1 F (36.7 C)  Min: 98.1 F (36.7 C)  Max: 98.1 F (36.7 C)  BP 132/84 BP  Min: 116/87  Max: 149/79  HR 90 Pulse  Avg: 99  Min: 88  Max: 132  RR 18 Resp  Avg: 18.3  Min: 16  Max: 22  SaO2 99 % Room Air SpO2  Avg: 99.5 %  Min: 98 %  Max: 100 %       24 Hour I/O Current Shift I/O  Time Ins Outs No intake/output data recorded. No intake/output data recorded.    There is no height or weight on file to calculate BMI. General appearance: Well nourished, well  developed female in no acute distress.  Cardiovascular: S1, S2 normal, no murmur, rub or gallop, regular rate and rhythm Respiratory:  Clear to auscultation bilateral. Normal respiratory effort Abdomen: mildly ttp in low belly area. No peritoneal s/s.  Neuro/Psych:  Normal mood and affect.  Skin:  Warm and dry.  Extremities: no clubbing, cyanosis, or edema.  Per the ED Scant dark red blood noted in vaginal vault and from cervical os.  Cervical os is closed.  No cervical discharge noted.  Tenderness to uterus and left adnexa, no mass or enlargement noted.  Laboratory: UPT: neg Pending: GC/CT Recent Labs  Lab 09/16/20 1220  WBC 13.9*  HGB 8.6*  HCT 27.0*  PLT 381   Recent Labs  Lab 09/16/20 1205  NA 137  K 3.9  CL 104  CO2 21*  BUN 9  CREATININE 0.73  CALCIUM 9.5  PROT 7.7  BILITOT 0.7  ALKPHOS 60  ALT 21  AST 20  GLUCOSE 150*    Imaging:  Narrative & Impression  CLINICAL DATA:  Vaginal bleeding since last night.   EXAM: OBSTETRIC <14 WK [place] AND TRANSVAGINAL OB US   TECHNIQUE: Both transabdominal and transvaginal ultrasound examinations were performed for complete evaluation of the gestation as well as the maternal uterus, adnexal regions, and pelvic cul-de-sac. Transvaginal technique was performed to assess early pregnancy.   COMPARISON:  None.   FINDINGS: Intrauterine gestational sac: None   Yolk sac:  Not Visualized.   Embryo:  Not Visualized.   Maternal uterus/adnexae: Normal appearance of the bilateral ovaries. No definite ectopic pregnancy seen.   Moderate amount of complex free fluid in the pelvis.   IMPRESSION: No intrauterine pregnancy seen.   Hemoperitoneum.   Close clinical follow-up to exclude ruptured ectopic pregnancy is recommended.     Electronically Signed   By: Ted Mcalpine M.D.   On: 09/16/2020 14:29    Assessment: Leah Maldonado is a 20 y.o. G3P1011 (Patient's last menstrual period was 09/11/2020.) here for pregnancy  of unknown location; pt stable  Plan: I told her that she has no pregnancy seen on u/s but with anemia and + free fluid on u/s that we have to do a diagnostic laparoscopy and possible removal of a fallopian tube. I also told her I recommend doing a  d&c with decision based on intra operative findings.   Will get 2U PRBCs on hold; pt okay with transfusion PRN. Can proceed when type and screen is back   Cornelia Copa. MD Attending Center for [employer] [facility])

## 2020-09-16 NOTE — Brief Op Note (Signed)
09/16/2020  6:29 PM  PATIENT:  Leah Maldonado Reason  20 y.o. female  PRE-OPERATIVE DIAGNOSIS:  possible ectopic pregnancy  POST-OPERATIVE DIAGNOSIS:  Ruptured right ectopic pregnancy  PROCEDURE:  laparoscopic right salpingectomy  SURGEON:  Surgeon(s) and Role:    * [place] Bing, MD - Primary  PHYSICIAN ASSISTANT:   ASSISTANTS: none   ANESTHESIA:   local and general  EBL:  750 mL of hemoperitoneum. 32mL for the case  IVF: crystalloid, albumin  BLOOD ADMINISTERED:none  DRAINS:  indwelling foley UOP    LOCAL MEDICATIONS USED:  MARCAINE     SPECIMEN:  Source of Specimen:  right fallopian tube  DISPOSITION OF SPECIMEN:  PATHOLOGY  COUNTS:  YES  TOURNIQUET:  * No tourniquets in log *  DICTATION: .Note written in EPIC  PLAN OF CARE: Discharge to home after PACU  PATIENT DISPOSITION:  PACU - hemodynamically stable.   Delay start of Pharmacological VTE agent (>24hrs) due to surgical blood loss or risk of bleeding: not applicable  Cornelia Copa MD Attending Center for [facility] (Faculty Practice) GYN Consult Phone: [phone] (M-F, [phone]) & [phone] (Off hours, weekends, holidays)

## 2020-09-16 NOTE — Op Note (Signed)
Operative Note   09/16/2020  PRE-OP DIAGNOSIS: Concern for ruptured ectopic pregnancy due to moderate free fluid on ultrasound, pregnancy of unknown location and quanitative beta hCG   POST-OP DIAGNOSIS: Ruptured right fallopian tube ectopic pregnancy   SURGEON: Surgeon(s) and Role:    * [place] Bing, MD - Primary  ASSISTANT: None  ANESTHESIA: General and local   PROCEDURE: Laparoscopic right salpingectomy  ESTIMATED BLOOD LOSS: hemoperitoneum. 12mL for the case  DRAINS: Indwelling foley UOP   TOTAL IV FLUIDS: crystalloid, albumin  SPECIMENS: right fallopain tube   VTE PROPHYLAXIS: SCDs to the bilateral lower extremities  ANTIBIOTICS: not indicated  COMPLICATIONS: None  DISPOSITION: PACU - hemodynamically stable.  CONDITION: stable  FINDINGS: Exam under anesthesia revealed normal EGBUS, vaginal vault with no bleeding, normal cervix.  Laparoscopic survey of the abdomen revealed large amount of hemoperitoneum. Grossly normal uterus, bilateral ovaries and left fallopian tube. Engorged fallopian tube at the mid section with rupture and slow bleeding. Normal stomach and liver edge and no intra-abdominal adhesions.   PROCEDURE IN DETAIL: The patient was taken to the OR where anesthesia was administed. The patient was positioned in dorsal lithotomy in the [place] stirrups. The patient was then examined under anesthesia with the above noted findings. The patient was prepped and draped in the normal sterile fashion and foley catheter was placed. A Graves speculum was placed in the vagina and the anterior lip of the cervix was grasped with a single toothed tenaculum.  A sponge stick was placed in the vagina; the speculum was then removed.  After changing gloves, attention was turned to the patient's abdomen where a 10 mm skin incision was made in the umbilical fold, after injection of local anesthesia. Using the open technique, the abdomen was entered and  pneumoperitoneum was obtained. The balloon trocar was then placed through the sleeve and the laparoscope was introduced into the abdomen with the above noted findings, after inspection of the entry site and then placing the patient in Trendelenburg.  A 70mm left and suprapubic port were then placed under direct visualization and after injection of local anesthesia.  As much clot and blood as possible was removed with a large amount of it adherent to the right adnexa.  Eventually, the bilateral adnexa were visualized with the above noted findings. Using the Ligasure, the mesosalpinx on the right was serially cauterized and transected until the tube was transected at the cornua and the tube removed via an endocatch bag. The abdomen was further suctioned of all blood and clot and the pressure lowered to and the operative area was hemostatic. The two 40mm ports were then removed under direct visualization, the gas released and the umbilical port removed.  The fascia at the umbilical incision was reapproximated with 0 vicryl. The skin was closed with 4-0 monocryl and the 42mm ports closed with dermabond. The sponge stick was removed with no bleeding noted on speculum exam.  The foley catheter was removed. The patient tolerated the procedure well. All counts were correct x 2. The patient was transferred to the recovery room awake, alert and breathing independently.   Cornelia Copa MD Attending Center for [employer] [facility])

## 2020-09-16 NOTE — ED Triage Notes (Signed)
Pt reports sudden onset bilateral shoulder and rib cage pain that began last night. Pt also endorses sudden onset heavy vaginal bleeding that began last night. She states that he period just finished a few days ago. Pt denies any trauma or injury.

## 2020-09-17 ENCOUNTER — Encounter (HOSPITAL_COMMUNITY): Payer: Self-pay | Admitting: Obstetrics and Gynecology

## 2020-09-17 LAB — RPR: RPR Ser Ql: NONREACTIVE

## 2020-09-17 LAB — POCT I-STAT EG7
Acid-base deficit: 2 mmol/L (ref 0.0–2.0)
Bicarbonate: 22.3 mmol/L (ref 20.0–28.0)
Calcium, Ion: 1.24 mmol/L (ref 1.15–1.40)
HCT: 27 % — ABNORMAL LOW (ref 36.0–46.0)
Hemoglobin: 9.2 g/dL — ABNORMAL LOW (ref 12.0–15.0)
O2 Saturation: 60 %
Potassium: 4 mmol/L (ref 3.5–5.1)
Sodium: 140 mmol/L (ref 135–145)
TCO2: 23 mmol/L (ref 22–32)
pCO2, Ven: 36.2 mmHg — ABNORMAL LOW (ref 44.0–60.0)
pH, Ven: 7.398 (ref 7.250–7.430)
pO2, Ven: 31 mmHg — CL (ref 32.0–45.0)

## 2020-09-17 LAB — GC/CHLAMYDIA PROBE AMP (~~LOC~~) NOT AT ARMC
Chlamydia: NEGATIVE
Comment: NEGATIVE
Comment: NORMAL
Neisseria Gonorrhea: NEGATIVE

## 2020-09-18 LAB — SURGICAL PATHOLOGY

## 2020-10-25 ENCOUNTER — Ambulatory Visit: Payer: Medicaid Other | Admitting: Obstetrics and Gynecology

## 2021-08-30 ENCOUNTER — Other Ambulatory Visit: Payer: Self-pay

## 2021-08-30 ENCOUNTER — Encounter (HOSPITAL_COMMUNITY): Payer: Self-pay

## 2021-08-30 ENCOUNTER — Emergency Department (HOSPITAL_COMMUNITY)
Admission: EM | Admit: 2021-08-30 | Discharge: 2021-08-30 | Discharge status: Against Medical Advice | Payer: Medicaid Other | Attending: Emergency Medicine | Admitting: Emergency Medicine

## 2021-08-30 DIAGNOSIS — R112 Nausea with vomiting, unspecified: Secondary | ICD-10-CM | POA: Diagnosis not present

## 2021-08-30 DIAGNOSIS — R110 Nausea: Secondary | ICD-10-CM

## 2021-08-30 DIAGNOSIS — J029 Acute pharyngitis, unspecified: Secondary | ICD-10-CM | POA: Diagnosis not present

## 2021-08-30 DIAGNOSIS — R197 Diarrhea, unspecified: Secondary | ICD-10-CM | POA: Diagnosis not present

## 2021-08-30 DIAGNOSIS — R1084 Generalized abdominal pain: Secondary | ICD-10-CM

## 2021-08-30 DIAGNOSIS — R519 Headache, unspecified: Secondary | ICD-10-CM | POA: Insufficient documentation

## 2021-08-30 LAB — URINALYSIS, ROUTINE W REFLEX MICROSCOPIC
Bilirubin Urine: NEGATIVE
Glucose, UA: NEGATIVE mg/dL
Hgb urine dipstick: NEGATIVE
Ketones, ur: NEGATIVE mg/dL
Nitrite: NEGATIVE
Protein, ur: NEGATIVE mg/dL
Specific Gravity, Urine: 1.018 (ref 1.005–1.030)
pH: 7 (ref 5.0–8.0)

## 2021-08-30 LAB — BASIC METABOLIC PANEL
Anion gap: 10 (ref 5–15)
BUN: 9 mg/dL (ref 6–20)
CO2: 24 mmol/L (ref 22–32)
Calcium: 9.2 mg/dL (ref 8.9–10.3)
Chloride: 110 mmol/L (ref 98–111)
Creatinine, Ser: 0.69 mg/dL (ref 0.44–1.00)
GFR, Estimated: >60 mL/min (ref >60)
Glucose, Bld: 103 mg/dL — ABNORMAL HIGH (ref 70–99)
Potassium: 3.7 mmol/L (ref 3.5–5.1)
Sodium: 144 mmol/L (ref 135–145)

## 2021-08-30 LAB — CBC WITH DIFFERENTIAL/PLATELET
Abs Immature Granulocytes: 0.02 10*3/uL (ref 0.00–0.07)
Basophils Absolute: 0 10*3/uL (ref 0.0–0.1)
Basophils Relative: 0 %
Eosinophils Absolute: 0.3 10*3/uL (ref 0.0–0.5)
Eosinophils Relative: 3 %
HCT: 29.7 % — ABNORMAL LOW (ref 36.0–46.0)
Hemoglobin: 8.8 g/dL — ABNORMAL LOW (ref 12.0–15.0)
Immature Granulocytes: 0 %
Lymphocytes Relative: 31 %
Lymphs Abs: 2.9 10*3/uL (ref 0.7–4.0)
MCH: 23.2 pg — ABNORMAL LOW (ref 26.0–34.0)
MCHC: 29.6 g/dL — ABNORMAL LOW (ref 30.0–36.0)
MCV: 78.2 fL — ABNORMAL LOW (ref 80.0–100.0)
Monocytes Absolute: 0.5 10*3/uL (ref 0.1–1.0)
Monocytes Relative: 5 %
Neutro Abs: 5.6 10*3/uL (ref 1.7–7.7)
Neutrophils Relative %: 61 %
Platelets: 448 10*3/uL — ABNORMAL HIGH (ref 150–400)
RBC: 3.8 MIL/uL — ABNORMAL LOW (ref 3.87–5.11)
RDW: 16.9 % — ABNORMAL HIGH (ref 11.5–15.5)
WBC: 9.3 10*3/uL (ref 4.0–10.5)
nRBC: 0 % (ref 0.0–0.2)

## 2021-08-30 LAB — I-STAT BETA HCG BLOOD, ED (MC, WL, AP ONLY): I-stat hCG, quantitative: <5 m[IU]/mL (ref <5)

## 2021-08-30 LAB — GROUP A STREP BY PCR: Group A Strep by PCR: NOT DETECTED

## 2021-08-30 LAB — POC URINE PREG, ED: Preg Test, Ur: NEGATIVE

## 2021-08-30 NOTE — ED Triage Notes (Signed)
Patient also c/o sore throat x 4 days. Patient denies any fever.

## 2021-08-30 NOTE — ED Provider Notes (Signed)
[facility]-EMERGENCY DEPT Provider Note   CSN: [id number] Arrival date & time: 08/30/21  1626     History  Chief Complaint  Patient presents with   Headache   Emesis   Diarrhea   Sore Throat    Leah Maldonado is a 21 y.o. female.  Patient presents to the hospital complaining of sore throat, nausea, abdominal pain for the past 3 to 4 days.  Patient states that she has mild abdominal cramping with mild nausea.  Denies any emesis.  Endorses sore throat which has not improved since onset.  Patient denies any fevers and has not taken any medications at home.  She has been able to eat and drink without difficulty other than pain in the throat.  Denies any known sick contacts, chest pain, shortness of breath.  Past medical history significant for tobacco and marijuana use  HPI     Home Medications Prior to Admission medications   Medication Sig Start Date End Date Taking? Authorizing Provider  acetaminophen (TYLENOL) 500 MG tablet Take 500 mg by mouth every 6 (six) hours as needed.    [provider]  docusate sodium (COLACE) 100 MG capsule Take 1 capsule (100 mg total) by mouth 2 (two) times daily. Take bid for 14 days then bid prn constipation 09/16/20   [place] Bing, MD  ferrous sulfate (FERROUSUL) 325 (65 FE) MG tablet Take 1 tablet (325 mg total) by mouth every other day for 40 doses. 09/16/20 12/04/20  [place] Bing, MD  ibuprofen (ADVIL) 600 MG tablet Take 1 tablet (600 mg total) by mouth every 6 (six) hours as needed. 09/16/20   [place] Bing, MD  oxyCODONE-acetaminophen (PERCOCET/ROXICET) 5-325 MG tablet Take 1-2 tablets by mouth every 6 (six) hours as needed. 09/16/20   [place] Bing, MD      Allergies    Patient has no known allergies.    Review of Systems   Review of Systems  Constitutional:  Negative for fever.  HENT:  Positive for sore throat.   Respiratory:  Negative for shortness of breath.   Cardiovascular:  Negative for  chest pain.  Gastrointestinal:  Positive for abdominal pain and nausea. Negative for diarrhea and vomiting.  Genitourinary:  Negative for dysuria and vaginal discharge.    Physical Exam Updated Vital Signs BP (!) 138/91   Pulse 83   Temp 98.7 F (37.1 C) (Oral)   Resp 18   Ht 5\' 6"  (1.676 m)   Wt 90.7 kg   SpO2 100%   BMI 32.28 kg/m  Physical Exam Vitals and nursing note reviewed.  Constitutional:      General: She is not in acute distress. HENT:     Head: Normocephalic and atraumatic.     Mouth/Throat:     Mouth: Mucous membranes are moist.  Eyes:     Extraocular Movements: Extraocular movements intact.  Cardiovascular:     Rate and Rhythm: Normal rate and regular rhythm.     Heart sounds: Normal heart sounds.  Pulmonary:     Effort: Pulmonary effort is normal.     Breath sounds: Normal breath sounds.  Abdominal:     Palpations: Abdomen is soft.     Tenderness: There is no abdominal tenderness.  Musculoskeletal:        General: Normal range of motion.     Cervical back: Normal range of motion and neck supple.  Skin:    General: Skin is warm and dry.  Neurological:  Mental Status: She is alert.  Psychiatric:        Mood and Affect: Mood normal.     ED Results / Procedures / Treatments   Labs (all labs ordered are listed, but only abnormal results are displayed) Labs Reviewed  URINALYSIS, ROUTINE W REFLEX MICROSCOPIC - Abnormal; Notable for the following components:      Result Value   Leukocytes,Ua TRACE (*)    Bacteria, UA FEW (*)    All other components within normal limits  BASIC METABOLIC PANEL - Abnormal; Notable for the following components:   Glucose, Bld 103 (*)    All other components within normal limits  CBC WITH DIFFERENTIAL/PLATELET - Abnormal; Notable for the following components:   RBC 3.80 (*)    Hemoglobin 8.8 (*)    HCT 29.7 (*)    MCV 78.2 (*)    MCH 23.2 (*)    MCHC 29.6 (*)    RDW 16.9 (*)    Platelets 448 (*)    All other  components within normal limits  GROUP A STREP BY PCR  RESP PANEL BY RT-PCR (FLU A&B, COVID) ARPGX2  POC URINE PREG, ED  I-STAT BETA HCG BLOOD, ED ([facility], [facility], [facility] ONLY)    EKG None  Radiology No results found.  Procedures Procedures    Medications Ordered in ED Medications - No data to display  ED Course/ Medical Decision Making/ A&P                           Medical Decision Making Amount and/or Complexity of Data Reviewed Labs: ordered.   Patient presents with a chief complaint of sore throat with accompanying abdominal pain and nausea.  Differential includes strep, viral infection including COVID-19 or influenza, gastroenteritis, reflux, others  No relevant charts to review  I ordered and reviewed lab results.  Pertinent results include negative pregnancy test, no overt signs of infection on urinalysis, hemoglobin 8.8, consistent with patient's baseline, negative group A strep, COVID flu pending  Went to reevaluate patient and found the patient and left the hospital.  Restrooms and waiting area were checked.  Patient now listed as elopement        Final Clinical Impression(s) / ED Diagnoses Final diagnoses:  None    Rx / DC Orders ED Discharge Orders     None         Pamala Duffel 08/30/21 2118    Alvira Monday, MD 08/31/21 1225

## 2021-08-30 NOTE — ED Provider Triage Note (Signed)
Emergency Medicine Provider Triage Evaluation Note  Leah Maldonado , a 21 y.o. female  was evaluated in triage.  Pt complains of sore throat and headache.  Symptoms have been going on for 3 to 4 days.  Patient has had persistent nausea, but has only vomited once.  No significant or focal abdominal pain.  No fevers.  Throat described as "scratchy".  Review of Systems  Positive: Sore throat, nausea Negative: Fever  Physical Exam  BP (!) 169/87 (BP Location: Left Arm)   Pulse 94   Temp 98.7 F (37.1 C) (Oral)   Resp 18   Ht 5\' 6"  (1.676 m)   Wt 90.7 kg   SpO2 99%   BMI 32.28 kg/m  Gen:   Awake, no distress   Resp:  Normal effort  MSK:   Moves extremities without difficulty  Other:  Oropharynx is clear, no focal abdominal tenderness to palpation  Medical Decision Making  Medically screening exam initiated at 4:55 PM.  Appropriate orders placed.  was informed that the remainder of the evaluation will be completed by another provider, this initial triage assessment does not replace that evaluation, and the importance of remaining in the ED until their evaluation is complete.     Roylene Reason, PA-C 08/30/21 1656

## 2021-08-30 NOTE — ED Triage Notes (Signed)
Pt arrived via POV, c/o headache, n/v and diarrhea.

## 2021-08-30 NOTE — ED Notes (Signed)
Upon entering room, pt could not be located in exam room 24, pt was not found in restrooms or in department by RN and EMT-P.

## 2021-09-04 ENCOUNTER — Encounter: Payer: Self-pay | Admitting: Radiology
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
## 2021-10-10 ENCOUNTER — Encounter (HOSPITAL_COMMUNITY): Payer: Self-pay

## 2021-10-10 ENCOUNTER — Emergency Department (HOSPITAL_COMMUNITY): Payer: Medicaid Other

## 2021-10-10 ENCOUNTER — Emergency Department (HOSPITAL_COMMUNITY)
Admission: EM | Admit: 2021-10-10 | Discharge: 2021-10-10 | Discharge status: Against Medical Advice | Payer: Medicaid Other | Attending: Emergency Medicine | Admitting: Emergency Medicine

## 2021-10-10 ENCOUNTER — Other Ambulatory Visit: Payer: Self-pay

## 2021-10-10 DIAGNOSIS — R079 Chest pain, unspecified: Secondary | ICD-10-CM | POA: Insufficient documentation

## 2021-10-10 DIAGNOSIS — R110 Nausea: Secondary | ICD-10-CM | POA: Insufficient documentation

## 2021-10-10 DIAGNOSIS — Z5321 Procedure and treatment not carried out due to patient leaving prior to being seen by health care provider: Secondary | ICD-10-CM | POA: Diagnosis not present

## 2021-10-10 DIAGNOSIS — R0602 Shortness of breath: Secondary | ICD-10-CM | POA: Insufficient documentation

## 2021-10-10 LAB — BASIC METABOLIC PANEL
Anion gap: 5 (ref 5–15)
BUN: 7 mg/dL (ref 6–20)
CO2: 25 mmol/L (ref 22–32)
Calcium: 9.1 mg/dL (ref 8.9–10.3)
Chloride: 110 mmol/L (ref 98–111)
Creatinine, Ser: 0.67 mg/dL (ref 0.44–1.00)
GFR, Estimated: >60 mL/min (ref >60)
Glucose, Bld: 87 mg/dL (ref 70–99)
Potassium: 4.2 mmol/L (ref 3.5–5.1)
Sodium: 140 mmol/L (ref 135–145)

## 2021-10-10 LAB — CBC
HCT: 31.5 % — ABNORMAL LOW (ref 36.0–46.0)
Hemoglobin: 9.3 g/dL — ABNORMAL LOW (ref 12.0–15.0)
MCH: 23.1 pg — ABNORMAL LOW (ref 26.0–34.0)
MCHC: 29.5 g/dL — ABNORMAL LOW (ref 30.0–36.0)
MCV: 78.4 fL — ABNORMAL LOW (ref 80.0–100.0)
Platelets: 475 10*3/uL — ABNORMAL HIGH (ref 150–400)
RBC: 4.02 MIL/uL (ref 3.87–5.11)
RDW: 17.7 % — ABNORMAL HIGH (ref 11.5–15.5)
WBC: 9.9 10*3/uL (ref 4.0–10.5)
nRBC: 0 % (ref 0.0–0.2)

## 2021-10-10 LAB — I-STAT BETA HCG BLOOD, ED (MC, WL, AP ONLY): I-stat hCG, quantitative: <5 m[IU]/mL (ref <5)

## 2021-10-10 LAB — TROPONIN I (HIGH SENSITIVITY): Troponin I (High Sensitivity): <2 ng/L (ref <18)

## 2021-10-10 NOTE — ED Triage Notes (Signed)
Patient reports that she has been having chest pain and feels like her "bones are sore" in her chest. Patient states the chest pain is worse with movement.

## 2021-10-10 NOTE — ED Provider Triage Note (Signed)
Emergency Medicine Provider Triage Evaluation Note  Leah Maldonado , a 21 y.o. female  was evaluated in triage.  Pt complains of chest pain.  States that same began 2 days ago and is worse with any movements.  States that same is located on the left side of her chest and does not radiate.  She endorses some associated shortness of breath and nausea.  Denies any history of heart problems.  No recent new activities.  No family history of heart problems either.  Review of Systems  Positive:  Negative:   Physical Exam  BP 128/81 (BP Location: Left Arm)   Pulse 91   Temp 98.1 F (36.7 C) (Oral)   Resp 18   Ht 5\' 6"  (1.676 m)   Wt 95.3 kg   LMP 10/10/2021   SpO2 99%   BMI 33.89 kg/m  Gen:   Awake, no distress   Resp:  Normal effort, speaking in complete sentences MSK:   Moves extremities without difficulty  Other:    Medical Decision Making  Medically screening exam initiated at 3:23 PM.  Appropriate orders placed.  12/10/2021 was informed that the remainder of the evaluation will be completed by another provider, this initial triage assessment does not replace that evaluation, and the importance of remaining in the ED until their evaluation is complete.     Roylene Reason, PA-C 10/10/21 1524

## 2022-11-19 ENCOUNTER — Other Ambulatory Visit: Payer: Self-pay

## 2022-11-19 ENCOUNTER — Encounter (HOSPITAL_COMMUNITY): Payer: Self-pay | Admitting: Emergency Medicine

## 2022-11-19 ENCOUNTER — Emergency Department (HOSPITAL_COMMUNITY)
Admission: EM | Admit: 2022-11-19 | Discharge: 2022-11-19 | Disposition: A | Discharge status: Home / Self Care | Payer: Medicaid Other | Attending: Emergency Medicine | Admitting: Emergency Medicine

## 2022-11-19 DIAGNOSIS — K0889 Other specified disorders of teeth and supporting structures: Secondary | ICD-10-CM | POA: Diagnosis present

## 2022-11-19 MED ORDER — AMOXICILLIN-POT CLAVULANATE 875-125 MG PO TABS: 1.0000 | ORAL_TABLET | Freq: Two times a day (BID) | ORAL | 0 refills | Status: AC

## 2022-11-19 MED ORDER — IBUPROFEN 800 MG PO TABS: 800.0000 mg | ORAL_TABLET | Freq: Three times a day (TID) | ORAL | 0 refills | Status: AC

## 2022-11-19 NOTE — ED Triage Notes (Signed)
Pt c/o left bottom dental pain since yesterday. States that her face was swollen yesterday, but is not swollen today.

## 2022-11-19 NOTE — Discharge Instructions (Addendum)
You are prescribed a short course of antibiotics in order to help treat your dental infection.  Please take 1 tablet twice a day for the next 7 days.  In addition, I provided you with medication to help with pain, please take 1 tablet up to 3 times a day for the next 5 days.  Please schedule an appointment with a dentist as this will ultimately be the solution to the dental pain.

## 2022-11-19 NOTE — ED Provider Notes (Signed)
[place] EMERGENCY DEPARTMENT AT [facility] Provider Note   CSN: [id number] Arrival date & time: 11/19/22  0358     History  Chief Complaint  Patient presents with   Dental Pain    Leah Maldonado is a 22 y.o. female.  22 y.o female with no PMH presents to the ED with a chief complaint of dental pain x left side x yesterday.  Patient reports she has had dental problems in the past, to the left side has not been able to see a dentist due to financial strain and insurance.  She also ports taking ibuprofen for pain without much improvement in symptoms.  Felt that her left face was swollen however this has now improved.  Denies any trouble tolerating her secretions, eye movement or fevers.   The history is provided by the patient.  Dental Pain Associated symptoms: no fever        Home Medications Prior to Admission medications   Medication Sig Start Date End Date Taking? Authorizing Provider  amoxicillin-clavulanate (AUGMENTIN) 875-125 MG tablet Take 1 tablet by mouth every 12 (twelve) hours for 7 days. 11/19/22 11/26/22 Yes Chris Cripps, Leonie Douglas, PA-C  ibuprofen (ADVIL) 800 MG tablet Take 1 tablet (800 mg total) by mouth 3 (three) times daily for 5 days. 11/19/22 11/24/22 Yes Artesia Berkey, Leonie Douglas, PA-C  acetaminophen (TYLENOL) 500 MG tablet Take 500 mg by mouth every 6 (six) hours as needed.    [provider]  docusate sodium (COLACE) 100 MG capsule Take 1 capsule (100 mg total) by mouth 2 (two) times daily. Take bid for 14 days then bid prn constipation 09/16/20   [place] Bing, MD  ferrous sulfate (FERROUSUL) 325 (65 FE) MG tablet Take 1 tablet (325 mg total) by mouth every other day for 40 doses. 09/16/20 12/04/20  Enola Bing, MD  oxyCODONE-acetaminophen (PERCOCET/ROXICET) 5-325 MG tablet Take 1-2 tablets by mouth every 6 (six) hours as needed. 09/16/20   [place] Bing, MD      Allergies    Patient has no known allergies.    Review of Systems   Review of Systems   Constitutional:  Negative for fever.  HENT:  Positive for dental problem.     Physical Exam Updated Vital Signs BP (!) 135/90 (BP Location: Right Arm)   Pulse 80   Temp 97.8 F (36.6 C) (Oral)   Resp 18   Ht 5\' 7"  (1.702 m)   Wt 95.3 kg   SpO2 100%   BMI 32.89 kg/m  Physical Exam Vitals and nursing note reviewed.  Constitutional:      Appearance: Normal appearance.  HENT:     Head: Normocephalic and atraumatic.     Mouth/Throat:     Mouth: Mucous membranes are moist.     Dentition: Abnormal dentition. Dental tenderness present. No dental abscesses.     Tongue: No lesions.     Palate: No mass.     Pharynx: Oropharynx is clear. No oropharyngeal exudate.     Comments: Poor dentition throughout, missing tooth to the bottom left, no visible abscess. Oropharynx is clear.  Cardiovascular:     Rate and Rhythm: Normal rate.  Pulmonary:     Effort: Pulmonary effort is normal.  Abdominal:     General: Abdomen is flat.  Musculoskeletal:     Cervical back: Normal range of motion and neck supple.  Skin:    General: Skin is warm and dry.  Neurological:     Mental Status: She is alert and  oriented to person, place, and time.     ED Results / Procedures / Treatments   Labs (all labs ordered are listed, but only abnormal results are displayed) Labs Reviewed - No data to display  EKG None  Radiology No results found.  Procedures Procedures    Medications Ordered in ED Medications - No data to display  ED Course/ Medical Decision Making/ A&P                                 Medical Decision Making   Patient here with dental pain has been ongoing since yesterday, had an episode where she fell she chipped a tooth of the left bottom side approximately a month ago.  Has not seen a dentist due to financial strain.  Taking ibuprofen for her symptoms without much improvement.  Reported some left-sided swelling.  She is tolerating her secretions, there is no pain with eye  movement.   On evaluation there is poor dentition throughout, missing teeth but no visible abscess noted.  Will treat prophylactically with antibiotics along with pain control.  I did refer her to the [facility] dental clinic in hopes that she can get in with a dentist.  She is agreeable to plan and treatment, discharge from the emergency department in stable condition.  Portions of this note were generated with Scientist, clinical (histocompatibility and immunogenetics). Dictation errors may occur despite best attempts at proofreading.   Final Clinical Impression(s) / ED Diagnoses Final diagnoses:  Pain, dental    Rx / DC Orders ED Discharge Orders          Ordered    ibuprofen (ADVIL) 800 MG tablet  3 times daily        11/19/22 0739    amoxicillin-clavulanate (AUGMENTIN) 875-125 MG tablet  Every 12 hours        11/19/22 0739              Claude Manges, PA-C 11/19/22 0742    Kommor, Wyn Forster, MD 11/19/22 [phone]

## 2023-03-28 ENCOUNTER — Other Ambulatory Visit: Payer: Self-pay

## 2023-03-28 ENCOUNTER — Emergency Department (HOSPITAL_COMMUNITY)
Admission: EM | Admit: 2023-03-28 | Discharge: 2023-03-29 | Discharge status: Against Medical Advice | Payer: Medicaid Other | Attending: Student | Admitting: Student

## 2023-03-28 ENCOUNTER — Encounter (HOSPITAL_COMMUNITY): Payer: Self-pay

## 2023-03-28 DIAGNOSIS — N6489 Other specified disorders of breast: Secondary | ICD-10-CM | POA: Diagnosis present

## 2023-03-28 DIAGNOSIS — Z5321 Procedure and treatment not carried out due to patient leaving prior to being seen by health care provider: Secondary | ICD-10-CM | POA: Diagnosis not present

## 2023-03-28 LAB — CBC WITH DIFFERENTIAL/PLATELET
Abs Immature Granulocytes: 0.02 10*3/uL (ref 0.00–0.07)
Basophils Absolute: 0.1 10*3/uL (ref 0.0–0.1)
Basophils Relative: 1 %
Eosinophils Absolute: 0.2 10*3/uL (ref 0.0–0.5)
Eosinophils Relative: 2 %
HCT: 32.9 % — ABNORMAL LOW (ref 36.0–46.0)
Hemoglobin: 10.2 g/dL — ABNORMAL LOW (ref 12.0–15.0)
Immature Granulocytes: 0 %
Lymphocytes Relative: 26 %
Lymphs Abs: 2.5 10*3/uL (ref 0.7–4.0)
MCH: 26.2 pg (ref 26.0–34.0)
MCHC: 31 g/dL (ref 30.0–36.0)
MCV: 84.4 fL (ref 80.0–100.0)
Monocytes Absolute: 0.8 10*3/uL (ref 0.1–1.0)
Monocytes Relative: 8 %
Neutro Abs: 6.2 10*3/uL (ref 1.7–7.7)
Neutrophils Relative %: 63 %
Platelets: 415 10*3/uL — ABNORMAL HIGH (ref 150–400)
RBC: 3.9 MIL/uL (ref 3.87–5.11)
RDW: 16.9 % — ABNORMAL HIGH (ref 11.5–15.5)
WBC: 9.7 10*3/uL (ref 4.0–10.5)
nRBC: 0 % (ref 0.0–0.2)

## 2023-03-28 LAB — COMPREHENSIVE METABOLIC PANEL
ALT: 15 U/L (ref 0–44)
AST: 13 U/L — ABNORMAL LOW (ref 15–41)
Albumin: 3.9 g/dL (ref 3.5–5.0)
Alkaline Phosphatase: 67 U/L (ref 38–126)
Anion gap: 10 (ref 5–15)
BUN: 10 mg/dL (ref 6–20)
CO2: 24 mmol/L (ref 22–32)
Calcium: 8.9 mg/dL (ref 8.9–10.3)
Chloride: 104 mmol/L (ref 98–111)
Creatinine, Ser: 0.55 mg/dL (ref 0.44–1.00)
GFR, Estimated: >60 mL/min (ref >60)
Glucose, Bld: 102 mg/dL — ABNORMAL HIGH (ref 70–99)
Potassium: 3.6 mmol/L (ref 3.5–5.1)
Sodium: 138 mmol/L (ref 135–145)
Total Bilirubin: 0.6 mg/dL (ref 0.0–1.2)
Total Protein: 8 g/dL (ref 6.5–8.1)

## 2023-03-28 LAB — I-STAT CG4 LACTIC ACID, ED: Lactic Acid, Venous: 0.6 mmol/L (ref 0.5–1.9)

## 2023-03-28 NOTE — ED Triage Notes (Signed)
Hole on right breast with redness over entire breast that started 2 days ago. Pt reports discharge is coming out that is green. Denies fever, chills, body aches. Hx of HS

## 2023-03-29 ENCOUNTER — Emergency Department (HOSPITAL_COMMUNITY): Payer: Medicaid Other

## 2023-05-05 ENCOUNTER — Ambulatory Visit: Payer: Medicaid Other | Admitting: Family Medicine

## 2023-05-05 NOTE — Progress Notes (Deleted)
   New Patient Office Visit  Subjective   Patient ID: Leah Maldonado, female    DOB: 07/30/2000  Age: 23 y.o. MRN: [id number]  CC: No chief complaint on file.   HPI Leah Maldonado presents to establish care ***  Skin complaint  PMH: Anemia normocytic  PSH: ***  FH: ***  Tobacco use: *** Alcohol use: *** Drug use: *** Marital status: *** Employment: *** Sexual hx: ***  Screenings:  Colon Cancer: *** Lung Cancer: *** Breast Cancer: *** Diabetes: *** HLD: ***   Outpatient Encounter Medications as of 05/05/2023  Medication Sig   acetaminophen (TYLENOL) 500 MG tablet Take 500 mg by mouth every 6 (six) hours as needed.   docusate sodium (COLACE) 100 MG capsule Take 1 capsule (100 mg total) by mouth 2 (two) times daily. Take bid for 14 days then bid prn constipation   ferrous sulfate (FERROUSUL) 325 (65 FE) MG tablet Take 1 tablet (325 mg total) by mouth every other day for 40 doses.   oxyCODONE-acetaminophen (PERCOCET/ROXICET) 5-325 MG tablet Take 1-2 tablets by mouth every 6 (six) hours as needed.   No facility-administered encounter medications on file as of 05/05/2023.    Past Medical History:  Diagnosis Date   Chlamydia infection affecting pregnancy 07/01/2016   Tx TOC negative   Medical history non-contributory     Past Surgical History:  Procedure Laterality Date   DIAGNOSTIC LAPAROSCOPY WITH REMOVAL OF ECTOPIC PREGNANCY N/A 09/16/2020   Procedure: DIAGNOSTIC LAPAROSCOPY WITH REMOVAL OF ECTOPIC PREGNANCY;  Surgeon: [place]wood Bing, MD;  Location: [facility] OR;  Service: Gynecology;  Laterality: N/A;   LAPAROSCOPIC UNILATERAL SALPINGECTOMY Right 09/16/2020   Procedure: LAPAROSCOPIC UNILATERAL SALPINGECTOMY;  Surgeon: Gilman Bing, MD;  [facility] OR;  Service: Gynecology;  Laterality: Right;   NO PAST SURGERIES      Family History  Problem Relation Age of Onset   Healthy Mother    Healthy Father     Social History   Socioeconomic History   Marital  status: Single    Spouse name: Not on file   Number of children: Not on file   Years of education: Not on file   Highest education level: Not on file  Occupational History   Not on file  Tobacco Use   Smoking status: Never   Smokeless tobacco: Never  Vaping Use   Vaping status: Never Used  Substance and Sexual Activity   Alcohol use: Yes   Drug use: Not Currently    Types: Marijuana   Sexual activity: Yes    Birth control/protection: Condom  Other Topics Concern   Not on file  Social History Narrative   ** Merged History Encounter **       Social Drivers of Corporate investment banker Strain: Not on file  Food Insecurity: Not on file  Transportation Needs: Not on file  Physical Activity: Not on file  Stress: Not on file  Social Connections: Not on file  Intimate Partner Violence: Not on file    ROS     Objective   There were no vitals taken for this visit.  Physical Exam     Assessment & Plan:   There are no diagnoses linked to this encounter.  No follow-ups on file.   Sandre Kitty, MD

## 2023-09-20 ENCOUNTER — Inpatient Hospital Stay (HOSPITAL_COMMUNITY)
Admission: AD | Admit: 2023-09-20 | Discharge: 2023-09-20 | Disposition: A | Discharge status: Home / Self Care | Attending: Obstetrics & Gynecology | Admitting: Obstetrics & Gynecology

## 2023-09-20 ENCOUNTER — Inpatient Hospital Stay (HOSPITAL_COMMUNITY)

## 2023-09-20 ENCOUNTER — Encounter (HOSPITAL_COMMUNITY): Payer: Self-pay

## 2023-09-20 DIAGNOSIS — O26891 Other specified pregnancy related conditions, first trimester: Secondary | ICD-10-CM | POA: Diagnosis not present

## 2023-09-20 DIAGNOSIS — O208 Other hemorrhage in early pregnancy: Secondary | ICD-10-CM | POA: Diagnosis not present

## 2023-09-20 DIAGNOSIS — Z3A09 9 weeks gestation of pregnancy: Secondary | ICD-10-CM | POA: Diagnosis not present

## 2023-09-20 DIAGNOSIS — R12 Heartburn: Secondary | ICD-10-CM

## 2023-09-20 DIAGNOSIS — O219 Vomiting of pregnancy, unspecified: Secondary | ICD-10-CM

## 2023-09-20 DIAGNOSIS — O09292 Supervision of pregnancy with other poor reproductive or obstetric history, second trimester: Secondary | ICD-10-CM | POA: Diagnosis not present

## 2023-09-20 DIAGNOSIS — O468X1 Other antepartum hemorrhage, first trimester: Secondary | ICD-10-CM

## 2023-09-20 DIAGNOSIS — R109 Unspecified abdominal pain: Secondary | ICD-10-CM | POA: Diagnosis not present

## 2023-09-20 DIAGNOSIS — O418X10 Other specified disorders of amniotic fluid and membranes, first trimester, not applicable or unspecified: Secondary | ICD-10-CM | POA: Diagnosis not present

## 2023-09-20 LAB — CBC
HCT: 35 % — ABNORMAL LOW (ref 36.0–46.0)
Hemoglobin: 11.5 g/dL — ABNORMAL LOW (ref 12.0–15.0)
MCH: 27.6 pg (ref 26.0–34.0)
MCHC: 32.9 g/dL (ref 30.0–36.0)
MCV: 84.1 fL (ref 80.0–100.0)
Platelets: 379 K/uL (ref 150–400)
RBC: 4.16 MIL/uL (ref 3.87–5.11)
RDW: 17 % — ABNORMAL HIGH (ref 11.5–15.5)
WBC: 8.9 K/uL (ref 4.0–10.5)
nRBC: 0 % (ref 0.0–0.2)

## 2023-09-20 LAB — POCT PREGNANCY, URINE: Preg Test, Ur: POSITIVE — AB

## 2023-09-20 LAB — URINALYSIS, ROUTINE W REFLEX MICROSCOPIC
Bacteria, UA: NONE SEEN
Bilirubin Urine: NEGATIVE
Glucose, UA: NEGATIVE mg/dL
Hgb urine dipstick: NEGATIVE
Ketones, ur: NEGATIVE mg/dL
Leukocytes,Ua: NEGATIVE
Nitrite: NEGATIVE
Protein, ur: NEGATIVE mg/dL
Specific Gravity, Urine: 1.02 (ref 1.005–1.030)
pH: 8 (ref 5.0–8.0)

## 2023-09-20 LAB — HCG, QUANTITATIVE, PREGNANCY: hCG, Beta Chain, Quant, S: 122423 m[IU]/mL — ABNORMAL HIGH (ref <5)

## 2023-09-20 MED ORDER — METOCLOPRAMIDE HCL 10 MG PO TABS
10.0000 mg | ORAL_TABLET | Freq: Three times a day (TID) | ORAL | 1 refills | Status: DC | PRN
Start: 2023-09-20 — End: 2024-01-26

## 2023-09-20 MED ORDER — FAMOTIDINE 20 MG PO TABS
20.0000 mg | ORAL_TABLET | Freq: Two times a day (BID) | ORAL | 0 refills | Status: DC
Start: 2023-09-20 — End: 2024-01-26

## 2023-09-20 MED ORDER — METOCLOPRAMIDE HCL 10 MG PO TABS
10.0000 mg | ORAL_TABLET | Freq: Once | ORAL | Status: AC
Start: 2023-09-20 — End: 2023-09-20
  Administered 2023-09-20: 10 mg via ORAL
  Filled 2023-09-20: qty 1

## 2023-09-20 NOTE — MAU Note (Signed)
..  Leah Maldonado is a 23 y.o. at Unknown here in MAU reporting: +HPT on 08/13/23. She reports nausea and vomiting that started Wednesday of last week (7/16). Has been unable to keep anything down since Friday. Also having diarrhea that started Wednesday as well. Reports epigastric pain 5/10. Denies VB or abnormal discharge.   Pain score: 5 Vitals:   09/20/23 1231  BP: 135/83  Pulse: 80  Resp: 14  Temp: 98.2 F (36.8 C)  SpO2: 100%    LMP: 07/14/23 FHT:n/a Lab orders placed from triage:   UPT

## 2023-09-20 NOTE — MAU Provider Note (Signed)
 History     CSN: [id number]  Arrival date and time: 09/20/23 1216   Event Date/Time   First Provider Initiated Contact with Patient 09/20/23 1353      Chief Complaint  Patient presents with   Nausea   Emesis   Abdominal Pain   HPI Leah Maldonado is a 23 y.o. [id number] at [redacted]w[redacted]d who presents with n/v & abdominal pain. Reports n/v since Thursday. Has vomited about 3 times per day. Has also had daily episode of watery stool. Reports epigastric burning that is worse right before vomiting episodes. Denies lower abdominal pain, vaginal bleeding, dysuria, fever/chills. Hasn't treated symptoms. No sick contacts.   OB History     Gravida  4   Para  1   Term  1   Preterm  0   AB  2   Living  1      SAB  1   IAB  0   Ectopic  1   Multiple      Live Births  1           Past Medical History:  Diagnosis Date   Medical history non-contributory     Past Surgical History:  Procedure Laterality Date   DIAGNOSTIC LAPAROSCOPY WITH REMOVAL OF ECTOPIC PREGNANCY N/A 09/16/2020   Procedure: DIAGNOSTIC LAPAROSCOPY WITH REMOVAL OF ECTOPIC PREGNANCY;  Surgeon: Izell Harari, MD;  Location: [facility] OR;  Service: Gynecology;  Laterality: N/A;   LAPAROSCOPIC UNILATERAL SALPINGECTOMY Right 09/16/2020   Procedure: LAPAROSCOPIC UNILATERAL SALPINGECTOMY;  Surgeon: Izell Harari, MD;  Location: [facility] OR;  Service: Gynecology;  Laterality: Right;    Family History  Problem Relation Age of Onset   Healthy Mother    Healthy Father     Social History   Tobacco Use   Smoking status: Never   Smokeless tobacco: Never  Vaping Use   Vaping status: Never Used  Substance Use Topics   Alcohol use: Yes   Drug use: Not Currently    Types: Marijuana    Allergies: No Known Allergies  No medications prior to admission.    Review of Systems  All other systems reviewed and are negative.  Physical Exam   Blood pressure 135/83, pulse 80, temperature 98.2 F (36.8 C), temperature  source Oral, resp. rate 14, weight 95.3 kg, last menstrual period 07/14/2023, SpO2 100%, unknown if currently breastfeeding.  Physical Exam Vitals and nursing note reviewed.  Constitutional:      General: She is not in acute distress.    Appearance: She is well-developed. She is not ill-appearing.  HENT:     Head: Normocephalic and atraumatic.  Eyes:     General: No scleral icterus.       Right eye: No discharge.        Left eye: No discharge.     Conjunctiva/sclera: Conjunctivae normal.  Pulmonary:     Effort: Pulmonary effort is normal. No respiratory distress.  Neurological:     General: No focal deficit present.     Mental Status: She is alert.  Psychiatric:        Mood and Affect: Mood normal.        Behavior: Behavior normal.     MAU Course  Procedures Results for orders placed or performed during the hospital encounter of 09/20/23 (from the past 24 hours)  Pregnancy, urine POC     Status: Abnormal   Collection Time: 09/20/23 12:40 PM  Result Value Ref Range   Preg Test, Ur POSITIVE (A) NEGATIVE  Urinalysis, Routine w reflex microscopic -Urine, Clean Catch     Status: Abnormal   Collection Time: 09/20/23 12:48 PM  Result Value Ref Range   Color, Urine YELLOW YELLOW   APPearance HAZY (A) CLEAR   Specific Gravity, Urine 1.020 1.005 - 1.030   pH 8.0 5.0 - 8.0   Glucose, UA NEGATIVE NEGATIVE mg/dL   Hgb urine dipstick NEGATIVE NEGATIVE   Bilirubin Urine NEGATIVE NEGATIVE   Ketones, ur NEGATIVE NEGATIVE mg/dL   Protein, ur NEGATIVE NEGATIVE mg/dL   Nitrite NEGATIVE NEGATIVE   Leukocytes,Ua NEGATIVE NEGATIVE   RBC / HPF 0-5 0 - 5 RBC/hpf   WBC, UA 0-5 0 - 5 WBC/hpf   Bacteria, UA NONE SEEN NONE SEEN   Squamous Epithelial / HPF 6-10 0 - 5 /HPF   Mucus PRESENT   CBC     Status: Abnormal   Collection Time: 09/20/23  2:14 PM  Result Value Ref Range   WBC 8.9 4.0 - 10.5 K/uL   RBC 4.16 3.87 - 5.11 MIL/uL   Hemoglobin 11.5 (L) 12.0 - 15.0 g/dL   HCT 64.9 (L) 63.9 -  46.0 %   MCV 84.1 80.0 - 100.0 fL   MCH 27.6 26.0 - 34.0 pg   MCHC 32.9 30.0 - 36.0 g/dL   RDW 82.9 (H) 88.4 - 84.4 %   Platelets 379 150 - 400 K/uL   nRBC 0.0 0.0 - 0.2 %  hCG, quantitative, pregnancy     Status: Abnormal   Collection Time: 09/20/23  2:14 PM  Result Value Ref Range   hCG, Beta Chain, Quant, S 122,423 (H) <5 mIU/mL   US  OB Comp Less 14 Wks Result Date: 09/20/2023 CLINICAL DATA:  Abdominal pain in first trimester pregnancy. History of ectopic pregnancy. EXAM: OBSTETRIC <14 WK ULTRASOUND TECHNIQUE: Transabdominal ultrasound was performed for evaluation of the gestation as well as the maternal uterus and adnexal regions. COMPARISON:  None Available. FINDINGS: Intrauterine gestational sac: Single Yolk sac:  Visualized. Embryo:  Visualized. Cardiac Activity: Visualized. Heart Rate: 178 bpm CRL:   24.1 mm   9 w 1 d                  US  EDC: 04/23/2024 Subchorionic hemorrhage:  Small measuring 1.7 x 1.5 x 0.5 cm. Maternal uterus/adnexae: The left ovary is visualized and normal. The right ovary is not well seen on the current exam. No adnexal mass or pelvic free fluid. IMPRESSION: Single live intrauterine pregnancy estimated gestational age based on crown-rump length 9 weeks 1 day for ultrasound [facility] 04/23/2024. Small subchorionic hemorrhage. Electronically Signed   By: Andrea Gasman M.D.   On: 09/20/2023 15:16     Assessment and Plan   1. Nausea and vomiting during pregnancy  -Rx reglan  & pepcid . No vomiting while in MAU -Given reglan  in MAU with improvement in symptoms -Discussed imodium use for worsening diarrhea  2. Heartburn during pregnancy in first trimester  -Abdominal pain in epigastric area consistent with heartburn. Rx pepcid  -Given hx of ruptured ectopic, ultrasound ordered. Shows live IUP  3. Subchorionic hematoma in first trimester, single or unspecified fetus  -Reviewed bleeding precautions -RH positive  4. [redacted] weeks gestation of pregnancy  -Given list of  providers to start prenatal care     Rocky Satterfield 09/20/2023, 3:44 PM

## 2023-09-20 NOTE — Discharge Instructions (Signed)
   [facility] [employer] for [employer] at [facility]  [address], [place], [place] [address]  [phone]  Center for [facility] at [facility]  [address], [place], [place] [address]  [phone]  Center for [facility] Healthcare at [facility] [address], [place], [place] [address]  [phone]  Center for [facility] Healthcare at [facility] [address], [place], [place] [address] [phone]  Center for [facility] Healthcare at [facility]  [address] [place], [place] [address]  [phone]  Center for [facility] at [facility]  [address] (First floor), [place], [place] [address]  [phone]  Center for [facility] at [facility]  [address] [place], [place], [place] [address]  [phone]  [facility]  [address], [place], [place] [address]  [phone]  [facility]  [address] [place], [place], [place] [address]  [phone]  [facility]  [address] [place], [place] [address]  [phone]  [facility]  [address] [place], [place] [address]  [phone]  [facility]  [address], [place], [place] [address]  [phone]  [facility]   [address] [place], [place] [address]  [phone]  [facility]  [address] [place], [place] [address]   [phone]  [facility] [address], [place], [place] [address] [phone]  [facility]  [address] [place], [place] [address]  [phone]

## 2024-01-26 ENCOUNTER — Encounter (HOSPITAL_COMMUNITY): Payer: Self-pay | Admitting: Obstetrics and Gynecology

## 2024-01-26 ENCOUNTER — Inpatient Hospital Stay (HOSPITAL_COMMUNITY)
Admission: AD | Admit: 2024-01-26 | Discharge: 2024-01-26 | Disposition: A | Discharge status: Home / Self Care | Attending: Obstetrics and Gynecology | Admitting: Obstetrics and Gynecology

## 2024-01-26 DIAGNOSIS — O4693 Antepartum hemorrhage, unspecified, third trimester: Secondary | ICD-10-CM | POA: Diagnosis present

## 2024-01-26 DIAGNOSIS — Z3A28 28 weeks gestation of pregnancy: Secondary | ICD-10-CM | POA: Diagnosis not present

## 2024-01-26 DIAGNOSIS — R109 Unspecified abdominal pain: Secondary | ICD-10-CM | POA: Insufficient documentation

## 2024-01-26 DIAGNOSIS — N930 Postcoital and contact bleeding: Secondary | ICD-10-CM | POA: Diagnosis not present

## 2024-01-26 DIAGNOSIS — Z3689 Encounter for other specified antenatal screening: Secondary | ICD-10-CM

## 2024-01-26 DIAGNOSIS — O26893 Other specified pregnancy related conditions, third trimester: Secondary | ICD-10-CM | POA: Diagnosis not present

## 2024-01-26 LAB — WET PREP, GENITAL
Clue Cells Wet Prep HPF POC: NONE SEEN
Sperm: NONE SEEN
Trich, Wet Prep: NONE SEEN
WBC, Wet Prep HPF POC: <10 (ref <10)
Yeast Wet Prep HPF POC: NONE SEEN

## 2024-01-26 NOTE — MAU Note (Signed)
 Leah Maldonado is a 23 y.o. at [redacted]w[redacted]d here in MAU reporting: having cramping since early this morning every minute sometimes twice minute. Went to the Br and thought she saw some blood in the toilet and maybe when she wiped. Reportes intercourse last night . Good fetal movement reported.   LMP:  Onset of complaint: this morning Pain score: 8 Vitals:   01/26/24 0944  BP: 136/81  Pulse: 96  Resp: 18  Temp: 99 F (37.2 C)     FHT: 150  Lab orders placed from triage: u/a

## 2024-01-26 NOTE — MAU Provider Note (Signed)
 Chief Complaint:  Abdominal Pain and Vaginal Bleeding   HPI   Event Date/Time   First Provider Initiated Contact with Patient 01/26/24 0947      Leah Maldonado is a 23 y.o. [phone] at [redacted]w[redacted]d who presents to maternity admissions reporting that she was having some cramping early this morning after she woke up and when she went to the bathroom and wiped she saw a scant amount of pinkish blood.  Patient reports she did have sexual relations last night.  She denies any active red vaginal bleeding, leaking of fluid, contractions, and reports good fetal movements.   Pregnancy Course: Unestablished  Past Medical History:  Diagnosis Date   Medical history non-contributory    OB History  Gravida Para Term Preterm AB Living  4 1 1  0 2 1  SAB IAB Ectopic Multiple Live Births  1 0 1  1    # Outcome Date GA Lbr Len/2nd Weight Sex Type Anes PTL Lv  4 Current           3 Ectopic 08/2020          2 Term 2018     Vag-Spont   LIV  1 SAB            Past Surgical History:  Procedure Laterality Date   DIAGNOSTIC LAPAROSCOPY WITH REMOVAL OF ECTOPIC PREGNANCY N/A 09/16/2020   Procedure: DIAGNOSTIC LAPAROSCOPY WITH REMOVAL OF ECTOPIC PREGNANCY;  Surgeon: Izell Harari, MD;  Location: [facility] OR;  Service: Gynecology;  Laterality: N/A;   LAPAROSCOPIC UNILATERAL SALPINGECTOMY Right 09/16/2020   Procedure: LAPAROSCOPIC UNILATERAL SALPINGECTOMY;  Surgeon: Izell Harari, MD;  Location: [facility] OR;  Service: Gynecology;  Laterality: Right;   Family History  Problem Relation Age of Onset   Healthy Mother    Healthy Father    Social History   Tobacco Use   Smoking status: Never   Smokeless tobacco: Never  Vaping Use   Vaping status: Never Used  Substance Use Topics   Alcohol use: Not Currently   Drug use: Not Currently    Types: Marijuana   No Known Allergies Medications Prior to Admission  Medication Sig Dispense Refill Last Dose/Taking   Prenatal Vit-Fe Fumarate-FA (PRENATAL MULTIVITAMIN) TABS  tablet Take 1 tablet by mouth daily at 12 noon.   01/26/2024 Morning   acetaminophen  (TYLENOL ) 500 MG tablet Take 500 mg by mouth every 6 (six) hours as needed.      famotidine  (PEPCID ) 20 MG tablet Take 1 tablet (20 mg total) by mouth 2 (two) times daily. 60 tablet 0    metoCLOPramide  (REGLAN ) 10 MG tablet Take 1 tablet (10 mg total) by mouth every 8 (eight) hours as needed for nausea or vomiting. 30 tablet 1     I have reviewed patient's Past Medical Hx, Surgical Hx, Family Hx, Social Hx, medications and allergies.   ROS  Pertinent items noted in HPI and remainder of comprehensive ROS otherwise negative.   PHYSICAL EXAM  Patient Vitals for the past 24 hrs:  BP Temp Pulse Resp Height Weight  01/26/24 1010 136/79 -- 85 -- -- --  01/26/24 0944 136/81 99 F (37.2 C) 96 18 5' 7 (1.702 m) 93.9 kg    Constitutional: Well-developed, obese  female in no acute distress.  Cardiovascular: normal rate & rhythm, warm and well-perfused Respiratory: normal effort, no problems with respiration noted GI: Abd soft, non-tender, gravid MS: Extremities nontender, no edema, normal ROM Neurologic: Alert and oriented x 4.  Pelvic: Deferred ( Blind  self swabs obtained)     Fetal Tracing: Reactive for GA Baseline: 135 Variability: moderate  Accelerations: present  Decelerations: absent Toco: quite   Labs: No results found for this or any previous visit (from the past 24 hours).  Imaging:  No results found.  MDM & MAU COURSE  MDM:  HIGH  Chart reviewed Physical exam performed Vaginal swabs obtained ( pending at discharge - will contact patient if any results need to be discussed) UA ( Pending) NST for gestational age and fetal reassurance ( Reactive for GA) Toco: Quite Based on patient's HPI and recent sexual activity the spotting is likely caused from postcoital bleeding   MAU Course: Orders Placed This Encounter  Procedures   Wet prep, genital   Urinalysis, Routine w reflex  microscopic -Urine, Clean Catch   Discharge patient Discharge disposition: 01-Home or Self Care; Discharge patient date: 01/26/2024   I have reviewed the patient chart and performed the physical exam . I have ordered & interpreted the lab results and reviewed and interpreted the NST Medications ordered as stated below.  A/P as described below.  Counseling and education provided and patient agreeable  with plan as described below. Verbalized understanding.    ASSESSMENT   1. Postcoital bleeding   2. [redacted] weeks gestation of pregnancy   3. NST (non-stress test) reactive on fetal surveillance     PLAN  Discharge home in stable condition with return precautions.   See AVS for full description of information given to the patient including both verbal and written. Patient verbalized understanding and agrees with the plan as described above.      Allergies as of 01/26/2024   No Known Allergies      Medication List     STOP taking these medications    acetaminophen  500 MG tablet Commonly known as: TYLENOL    famotidine  20 MG tablet Commonly known as: PEPCID    metoCLOPramide  10 MG tablet Commonly known as: REGLAN        TAKE these medications    prenatal multivitamin Tabs tablet Take 1 tablet by mouth daily at 12 noon.        Olam Dalton, MSN, [facility], Center for [facility]    This chart was dictated using voice recognition software, Dragon. Despite the best efforts of this provider to proofread and correct errors, errors may still occur which can change documentation meaning.

## 2024-01-26 NOTE — Discharge Instructions (Addendum)
 The spotting that you experienced this morning is likely related to what we call postcoital bleeding after sexual activity. You may experience some light pinkish discharge or it may be brownish  as well as some light abdominal cramping.  This is normal.  Please return to the MAU and/or  contact your primary OB provider if you experience very heavy red vaginal bleeding, extreme lower abdominal cramping not responding to Tylenol , leaking of fluid or contractions.

## 2024-01-27 LAB — GC/CHLAMYDIA PROBE AMP (~~LOC~~) NOT AT ARMC
Chlamydia: NEGATIVE
Comment: NEGATIVE
Comment: NORMAL
Neisseria Gonorrhea: NEGATIVE
